# Patient Record
Sex: Male | Born: 1970
Health system: Southern US, Community
[De-identification: ages and names within clinical notes are randomized; demographics above are authoritative.]

## PROBLEM LIST (undated history)

## (undated) DIAGNOSIS — J302 Other seasonal allergic rhinitis: Secondary | ICD-10-CM

## (undated) DIAGNOSIS — M1990 Unspecified osteoarthritis, unspecified site: Secondary | ICD-10-CM

## (undated) DIAGNOSIS — F419 Anxiety disorder, unspecified: Secondary | ICD-10-CM

## (undated) DIAGNOSIS — T7840XA Allergy, unspecified, initial encounter: Secondary | ICD-10-CM

## (undated) HISTORY — PX: NO PAST SURGERIES: SHX2092

## (undated) HISTORY — DX: Unspecified osteoarthritis, unspecified site: M19.90

## (undated) HISTORY — DX: Other seasonal allergic rhinitis: J30.2

## (undated) HISTORY — DX: Allergy, unspecified, initial encounter: T78.40XA

## (undated) HISTORY — DX: Anxiety disorder, unspecified: F41.9

---

## 2013-07-11 ENCOUNTER — Other Ambulatory Visit: Payer: Self-pay

## 2013-07-11 ENCOUNTER — Encounter (HOSPITAL_BASED_OUTPATIENT_CLINIC_OR_DEPARTMENT_OTHER): Payer: Self-pay | Admitting: Emergency Medicine

## 2013-07-11 ENCOUNTER — Emergency Department (HOSPITAL_BASED_OUTPATIENT_CLINIC_OR_DEPARTMENT_OTHER)
Admission: EM | Admit: 2013-07-11 | Discharge: 2013-07-11 | Disposition: A | Discharge status: Home / Self Care | Payer: BC Managed Care – PPO | Attending: Emergency Medicine | Admitting: Emergency Medicine

## 2013-07-11 ENCOUNTER — Emergency Department (HOSPITAL_BASED_OUTPATIENT_CLINIC_OR_DEPARTMENT_OTHER): Payer: BC Managed Care – PPO

## 2013-07-11 DIAGNOSIS — X58XXXA Exposure to other specified factors, initial encounter: Secondary | ICD-10-CM | POA: Insufficient documentation

## 2013-07-11 DIAGNOSIS — R55 Syncope and collapse: Secondary | ICD-10-CM | POA: Insufficient documentation

## 2013-07-11 DIAGNOSIS — IMO0002 Reserved for concepts with insufficient information to code with codable children: Secondary | ICD-10-CM | POA: Insufficient documentation

## 2013-07-11 DIAGNOSIS — S1093XA Contusion of unspecified part of neck, initial encounter: Secondary | ICD-10-CM

## 2013-07-11 DIAGNOSIS — S0083XA Contusion of other part of head, initial encounter: Secondary | ICD-10-CM | POA: Insufficient documentation

## 2013-07-11 DIAGNOSIS — Y939 Activity, unspecified: Secondary | ICD-10-CM | POA: Insufficient documentation

## 2013-07-11 DIAGNOSIS — Z87891 Personal history of nicotine dependence: Secondary | ICD-10-CM | POA: Insufficient documentation

## 2013-07-11 DIAGNOSIS — Y929 Unspecified place or not applicable: Secondary | ICD-10-CM | POA: Insufficient documentation

## 2013-07-11 DIAGNOSIS — S0003XA Contusion of scalp, initial encounter: Secondary | ICD-10-CM | POA: Insufficient documentation

## 2013-07-11 LAB — BASIC METABOLIC PANEL
Anion gap: 14 (ref 5–15)
BUN: 26 mg/dL — ABNORMAL HIGH (ref 6–23)
CALCIUM: 9.3 mg/dL (ref 8.4–10.5)
CO2: 26 mEq/L (ref 19–32)
Chloride: 98 mEq/L (ref 96–112)
Creatinine, Ser: 1.3 mg/dL (ref 0.50–1.35)
GFR, EST AFRICAN AMERICAN: 76 mL/min — AB (ref >90)
GFR, EST NON AFRICAN AMERICAN: 66 mL/min — AB (ref >90)
GLUCOSE: 120 mg/dL — AB (ref 70–99)
Potassium: 3.9 mEq/L (ref 3.7–5.3)
SODIUM: 138 meq/L (ref 137–147)

## 2013-07-11 LAB — CBC
HCT: 38.8 % — ABNORMAL LOW (ref 39.0–52.0)
HEMOGLOBIN: 14.1 g/dL (ref 13.0–17.0)
MCH: 32.3 pg (ref 26.0–34.0)
MCHC: 36.3 g/dL — AB (ref 30.0–36.0)
MCV: 89 fL (ref 78.0–100.0)
PLATELETS: 179 10*3/uL (ref 150–400)
RBC: 4.36 MIL/uL (ref 4.22–5.81)
RDW: 12.5 % (ref 11.5–15.5)
WBC: 6 10*3/uL (ref 4.0–10.5)

## 2013-07-11 LAB — RAPID URINE DRUG SCREEN, HOSP PERFORMED
Amphetamines: NOT DETECTED
Barbiturates: NOT DETECTED
Benzodiazepines: NOT DETECTED
Cocaine: NOT DETECTED
OPIATES: NOT DETECTED
TETRAHYDROCANNABINOL: NOT DETECTED

## 2013-07-11 NOTE — ED Notes (Signed)
NP at bedside.
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
## 2013-07-11 NOTE — ED Provider Notes (Signed)
Medical screening examination/treatment/procedure(s) were performed by non-physician practitioner and as supervising physician I was immediately available for consultation/collaboration.   EKG Interpretation None        Houston Siren III, MD 07/11/13 2356

## 2013-07-11 NOTE — Discharge Instructions (Signed)
As discussed you can return here or to another er if you change your mind. i would recommend not taking your supplements or doing exercise until you have been evaluated further

## 2013-11-11 ENCOUNTER — Encounter (HOSPITAL_COMMUNITY): Payer: Self-pay

## 2013-11-11 ENCOUNTER — Emergency Department (HOSPITAL_COMMUNITY): Payer: BC Managed Care – PPO

## 2013-11-11 ENCOUNTER — Emergency Department (HOSPITAL_COMMUNITY)
Admission: EM | Admit: 2013-11-11 | Discharge: 2013-11-11 | Disposition: A | Discharge status: Home / Self Care | Payer: BC Managed Care – PPO | Attending: Emergency Medicine | Admitting: Emergency Medicine

## 2013-11-11 DIAGNOSIS — Z87891 Personal history of nicotine dependence: Secondary | ICD-10-CM | POA: Insufficient documentation

## 2013-11-11 DIAGNOSIS — S50812A Abrasion of left forearm, initial encounter: Secondary | ICD-10-CM | POA: Diagnosis not present

## 2013-11-11 DIAGNOSIS — S020XXA Fracture of vault of skull, initial encounter for closed fracture: Secondary | ICD-10-CM | POA: Diagnosis not present

## 2013-11-11 DIAGNOSIS — S0292XA Unspecified fracture of facial bones, initial encounter for closed fracture: Secondary | ICD-10-CM

## 2013-11-11 DIAGNOSIS — S50811A Abrasion of right forearm, initial encounter: Secondary | ICD-10-CM | POA: Insufficient documentation

## 2013-11-11 DIAGNOSIS — Y9289 Other specified places as the place of occurrence of the external cause: Secondary | ICD-10-CM | POA: Insufficient documentation

## 2013-11-11 DIAGNOSIS — Y9389 Activity, other specified: Secondary | ICD-10-CM | POA: Diagnosis not present

## 2013-11-11 DIAGNOSIS — S0990XA Unspecified injury of head, initial encounter: Secondary | ICD-10-CM | POA: Diagnosis present

## 2013-11-11 DIAGNOSIS — Y998 Other external cause status: Secondary | ICD-10-CM | POA: Insufficient documentation

## 2013-11-11 DIAGNOSIS — Z791 Long term (current) use of non-steroidal anti-inflammatories (NSAID): Secondary | ICD-10-CM | POA: Diagnosis not present

## 2013-11-11 LAB — CBC WITH DIFFERENTIAL/PLATELET
Basophils Absolute: 0 10*3/uL (ref 0.0–0.1)
Basophils Relative: 0 % (ref 0–1)
Eosinophils Absolute: 0 10*3/uL (ref 0.0–0.7)
Eosinophils Relative: 0 % (ref 0–5)
HEMATOCRIT: 38.6 % — AB (ref 39.0–52.0)
HEMOGLOBIN: 13.9 g/dL (ref 13.0–17.0)
LYMPHS ABS: 1 10*3/uL (ref 0.7–4.0)
Lymphocytes Relative: 12 % (ref 12–46)
MCH: 32.1 pg (ref 26.0–34.0)
MCHC: 36 g/dL (ref 30.0–36.0)
MCV: 89.1 fL (ref 78.0–100.0)
MONOS PCT: 4 % (ref 3–12)
Monocytes Absolute: 0.3 10*3/uL (ref 0.1–1.0)
NEUTROS ABS: 7 10*3/uL (ref 1.7–7.7)
Neutrophils Relative %: 84 % — ABNORMAL HIGH (ref 43–77)
Platelets: 135 10*3/uL — ABNORMAL LOW (ref 150–400)
RBC: 4.33 MIL/uL (ref 4.22–5.81)
RDW: 12.6 % (ref 11.5–15.5)
WBC: 8.4 10*3/uL (ref 4.0–10.5)

## 2013-11-11 LAB — COMPREHENSIVE METABOLIC PANEL
ALK PHOS: 53 U/L (ref 39–117)
ALT: 26 U/L (ref 0–53)
AST: 26 U/L (ref 0–37)
Albumin: 4.4 g/dL (ref 3.5–5.2)
Anion gap: 14 (ref 5–15)
BUN: 17 mg/dL (ref 6–23)
CO2: 24 mEq/L (ref 19–32)
Calcium: 8.6 mg/dL (ref 8.4–10.5)
Chloride: 102 mEq/L (ref 96–112)
Creatinine, Ser: 1.04 mg/dL (ref 0.50–1.35)
GFR, EST NON AFRICAN AMERICAN: 86 mL/min — AB (ref >90)
GLUCOSE: 103 mg/dL — AB (ref 70–99)
POTASSIUM: 4.2 meq/L (ref 3.7–5.3)
Sodium: 140 mEq/L (ref 137–147)
Total Bilirubin: 0.4 mg/dL (ref 0.3–1.2)
Total Protein: 6.7 g/dL (ref 6.0–8.3)

## 2013-11-11 LAB — RAPID URINE DRUG SCREEN, HOSP PERFORMED
Amphetamines: NOT DETECTED
Barbiturates: NOT DETECTED
Benzodiazepines: NOT DETECTED
Cocaine: NOT DETECTED
Opiates: NOT DETECTED
Tetrahydrocannabinol: NOT DETECTED

## 2013-11-11 LAB — ETHANOL: ALCOHOL ETHYL (B): 198 mg/dL — AB (ref 0–11)

## 2013-11-11 MED ORDER — ONDANSETRON 8 MG PO TBDP: 8.0000 mg | ORAL_TABLET | Freq: Three times a day (TID) | ORAL | Status: DC | PRN

## 2013-11-11 MED ORDER — HYDROCODONE-ACETAMINOPHEN 5-325 MG PO TABS: 2.0000 | ORAL_TABLET | ORAL | Status: DC | PRN

## 2013-11-11 MED ORDER — TETANUS-DIPHTH-ACELL PERTUSSIS 5-2.5-18.5 LF-MCG/0.5 IM SUSP: 0.5000 mL | Freq: Once | INTRAMUSCULAR | Status: DC

## 2013-11-11 MED ORDER — ONDANSETRON 4 MG PO TBDP
8.0000 mg | ORAL_TABLET | Freq: Once | ORAL | Status: AC
  Administered 2013-11-11: 8 mg via ORAL
  Filled 2013-11-11: qty 2
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
## 2013-11-11 NOTE — ED Notes (Signed)
Patient transported to CT 

## 2013-11-11 NOTE — Discharge Instructions (Signed)
Please return immediately to the ER if you have onset of clear fluid draining from your nose as this may be a sign of leakage of cerebrospinal fluid.  Do not blow your nose.  Sleep in a upright position to help with swelling.  Ice therapy as below.  Return to the ER for worsening pain, fevers, confusion or other new concerning symptoms.   Facial Fracture A facial fracture is a break in one of the bones of your face. HOME CARE INSTRUCTIONS   Protect the injured part of your face until it is healed.  Do not participate in activities which give chance for re-injury until your doctor approves.  Gently wash and dry your face.  Wear head and facial protection while riding a bicycle, motorcycle, or snowmobile. SEEK MEDICAL CARE IF:   An oral temperature above 102 F (38.9 C) develops.  You have severe headaches or notice changes in your vision.  You have new numbness or tingling in your face.  You develop nausea (feeling sick to your stomach), vomiting or a stiff neck. SEEK IMMEDIATE MEDICAL CARE IF:   You develop difficulty seeing or experience double vision.  You become dizzy, lightheaded, or faint.  You develop trouble speaking, breathing, or swallowing.  You have a watery discharge from your nose or ear. MAKE SURE YOU:   Understand these instructions.  Will watch your condition.  Will get help right away if you are not doing well or get worse. Document Released: 12/21/2004 Document Revised: 03/15/2011 Document Reviewed: 08/10/2007 [facility] Patient Information 2015 [place], [place]. This information is not intended to replace advice given to you by your health care provider. Make sure you discuss any questions you have with your health care provider.  Assault, General Assault includes any behavior, whether intentional or reckless, which results in bodily injury to another person and/or damage to property. Included in this would be any behavior, intentional or reckless, that by  its nature would be understood (interpreted) by a reasonable person as intent to harm another person or to damage his/her property. Threats may be oral or written. They may be communicated through regular mail, computer, fax, or phone. These threats may be direct or implied. FORMS OF ASSAULT INCLUDE:  Physically assaulting a person. This includes physical threats to inflict physical harm as well as:  Slapping.  Hitting.  Poking.  Kicking.  Punching.  Pushing.  Arson.  Sabotage.  Equipment vandalism.  Damaging or destroying property.  Throwing or hitting objects.  Displaying a weapon or an object that appears to be a weapon in a threatening manner.  Carrying a firearm of any kind.  Using a weapon to harm someone.  Using greater physical size/strength to intimidate another.  Making intimidating or threatening gestures.  Bullying.  Hazing.  Intimidating, threatening, hostile, or abusive language directed toward another person.  It communicates the intention to engage in violence against that person. And it leads a reasonable person to expect that violent behavior may occur.  Stalking another person. IF IT HAPPENS AGAIN:  Immediately call for emergency help (911 in [place]  If someone poses clear and immediate danger to you, seek legal authorities to have a protective or restraining order put in place.  Less threatening assaults can at least be reported to authorities. STEPS TO TAKE IF A SEXUAL ASSAULT HAS HAPPENED  Go to an area of safety. This may include a shelter or staying with a friend. Stay away from the area where you have been attacked. A large  percentage of sexual assaults are caused by a friend, relative or associate.  If medications were given by your caregiver, take them as directed for the full length of time prescribed.  Only take over-the-counter or prescription medicines for pain, discomfort, or fever as directed by your caregiver.  If you have  come in contact with a sexual disease, find out if you are to be tested again. If your caregiver is concerned about the HIV/AIDS virus, he/she may require you to have continued testing for several months.  For the protection of your privacy, test results can not be given over the phone. Make sure you receive the results of your test. If your test results are not back during your visit, make an appointment with your caregiver to find out the results. Do not assume everything is normal if you have not heard from your caregiver or the medical facility. It is important for you to follow up on all of your test results.  File appropriate papers with authorities. This is important in all assaults, even if it has occurred in a family or by a friend. SEEK MEDICAL CARE IF:  You have new problems because of your injuries.  You have problems that may be because of the medicine you are taking, such as:  Rash.  Itching.  Swelling.  Trouble breathing.  You develop belly (abdominal) pain, feel sick to your stomach (nausea) or are vomiting.  You begin to run a temperature.  You need supportive care or referral to a rape crisis center. These are centers with trained personnel who can help you get through this ordeal. SEEK IMMEDIATE MEDICAL CARE IF:  You are afraid of being threatened, beaten, or abused. In [place] call 911.  You receive new injuries related to abuse.  You develop severe pain in any area injured in the assault or have any change in your condition that concerns you.  You faint or lose consciousness.  You develop chest pain or shortness of breath. Document Released: 12/21/2004 Document Revised: 03/15/2011 Document Reviewed: 08/09/2007 [facility] Patient Information 2015 [place], [place]. This information is not intended to replace advice given to you by your health care provider. Make sure you discuss any questions you have with your health care provider.  Skull Fracture, Uncomplicated,  Adult You have a skull fracture. This happens when one of the bones of your head cracks or breaks. Your injury does not appear serious at this time and we feel you can be observed safely at home. An injury to the head that causes a skull fracture may also cause a concussion. With a concussion you may be knocked out for a brief moment (loss of consciousness). A concussion is the mildest form of traumatic brain injury. The symptoms of a concussion are short-lived and resolve on their own. The most common symptoms are confusion and forgetting what happened (amnesia). SYMPTOMS These minor symptoms may be experienced after discharge:  Memory difficulties.  Dizziness.  Headaches.  Hearing difficulties.  Vertigo or trouble with balance.  Depression.  Tiredness.  Difficulty with concentration.  Nausea.  Vomiting. A bruise on the brain (concussion) requires a few days for recovery the same as a bruise elsewhere on your body. It is common for people with injuries such as yours to experience such problems. Usually these problems disappear without medical care. If symptoms remain for more than a few days, notify your caregiver. See your caregiver sooner if symptoms are becoming worse rather than better. HOME CARE INSTRUCTIONS   During  the next 24 hours you should stay with an adult who can watch you for the above warning signs.  This person should wake you up every 02-03 hours to check on your condition, noting any of the above signs or symptoms. Problems which are getting worse mean you should call or return immediately to the facility where you were just seen, or to the nearest emergency department. In case of emergency or unconsciousness, call for local emergency medical help.  You should take clear liquids for the rest of the day and then resume your regular diet.  You should not take sedatives or alcoholic beverages for 48 hours after discharge.  After injuries such as yours, most serious  problems happen within the first 24 hours.  If x-rays or other testing were done, make sure you know how you are to get those results. It is your responsibility to call back for results when your caregiver suggests. Do not assume everything is fine if your caregiver has not been able to reach you.  Skull fractures usually do not need follow up x-rays. These fractures are not like broken arms. The position of the skull stays the same as when it was broken and usually heals without problems.  If you have a concussion be aware that symptoms may last up to a week or longer.  It is very important to keep any follow-up appointments after a head injury.  It is unlikely that serious side effects will occur. If they do occur it is usually soon after the accident but may occur as long as a week after the accident or injury. SEEK IMMEDIATE MEDICAL CARE IF:   Confusion or drowsiness develops; children frequently become drowsy after any type of trauma or injury.  A person cannot arouse the injured person.  You feel sick to your stomach (nausea) or persistent, forceful vomiting (projectile in nature).  Rapid back and forth movement of the eyes. This is called nystagmus.  Convulsions, seizures, or unconsciousness.  Severe persistent headaches not relieved by medication. Only take over-the-counter or prescription medicines for pain, discomfort, or fever as directed by your caregiver. (Do not take aspirin as this weakens blood clotting abilities).  Inability to use arms or legs appropriately.  Changes in pupil sizes.  Clear or bloody discharge from nose or ears. Document Released: 10/22/2003 Document Revised: 03/15/2011 Document Reviewed: 02/27/2008 [facility] Patient Information 2015 [place], [place]. This information is not intended to replace advice given to you by your health care provider. Make sure you discuss any questions you have with your health care provider.
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
## 2015-07-17 IMAGING — CT CT HEAD W/O CM
1 of 2 series · 16 of 30 positions shown, 20 images · non-contrast
Comparison: None

CLINICAL DATA: Syncopal episode.

EXAM:
CT HEAD WITHOUT CONTRAST
TECHNIQUE: Contiguous axial images were obtained from the base of the skull
through the vertex without contrast.

[Series 2: head 4.8 h37s · axial · 0.45mm/px · z∈[-124,+28]mm · 16 of 36 slices shown, 20 images]
[im 2/36  brain]
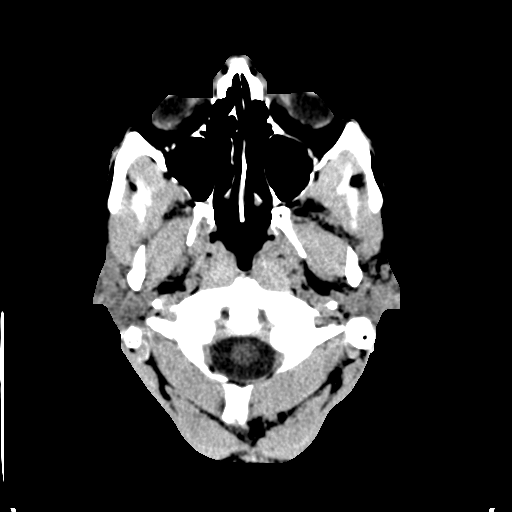
[im 2/36  bone]
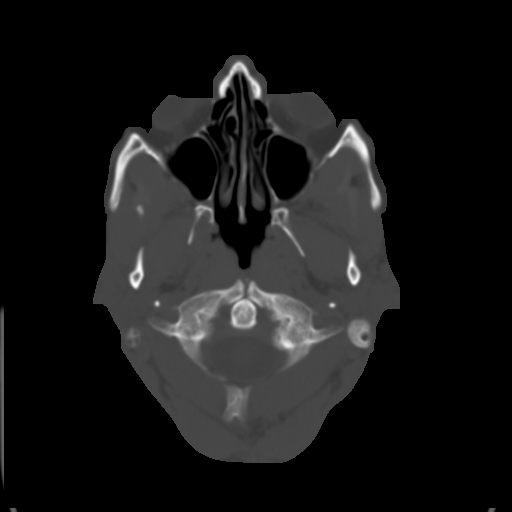
[im 5/36  brain]
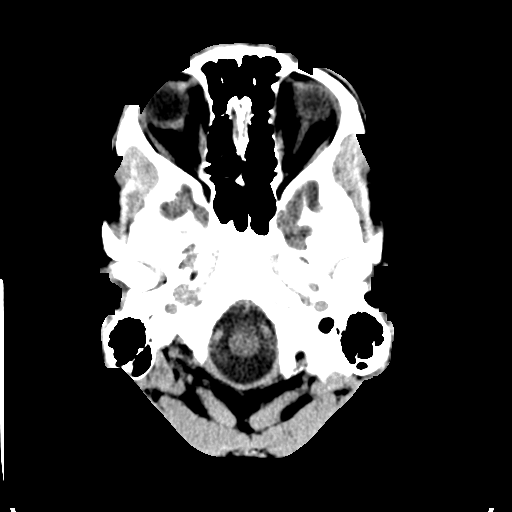
[im 6/36  brain]
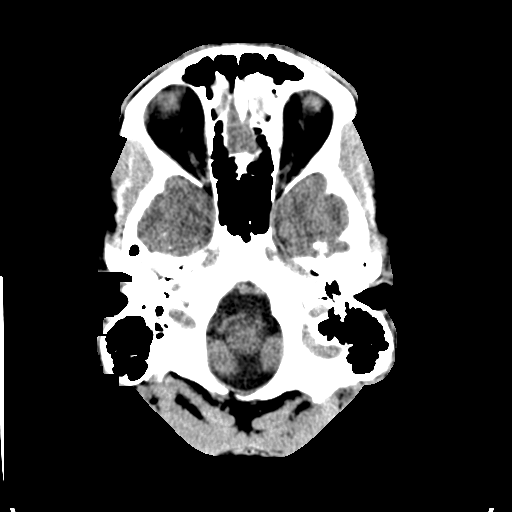
[im 9/36  brain]
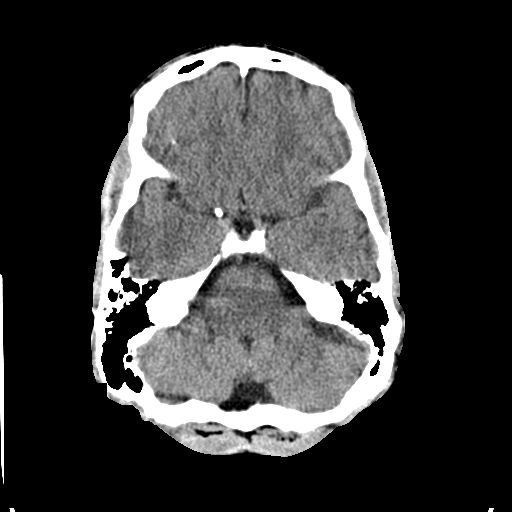
[im 11/36  brain]
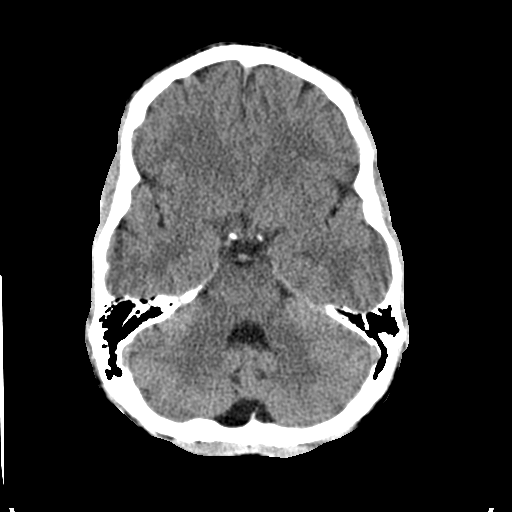
[im 11/36  bone]
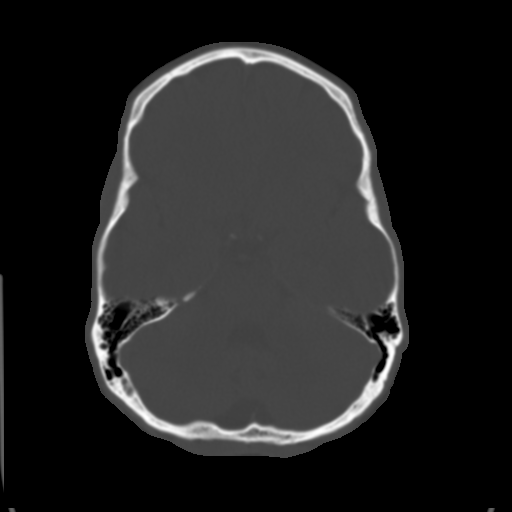
[im 12/36  brain]
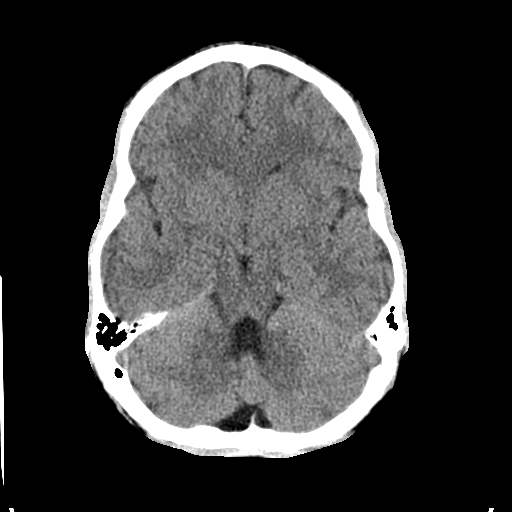
[im 15/36  brain]
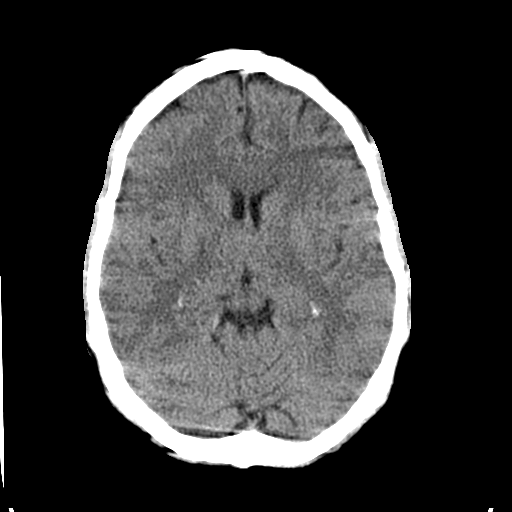
[im 17/36  brain]
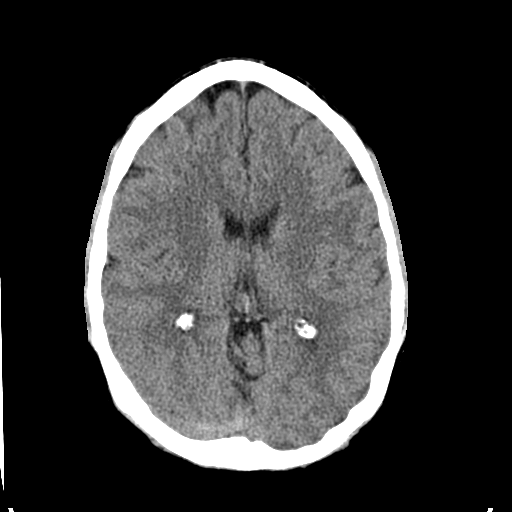
[im 19/36  brain]
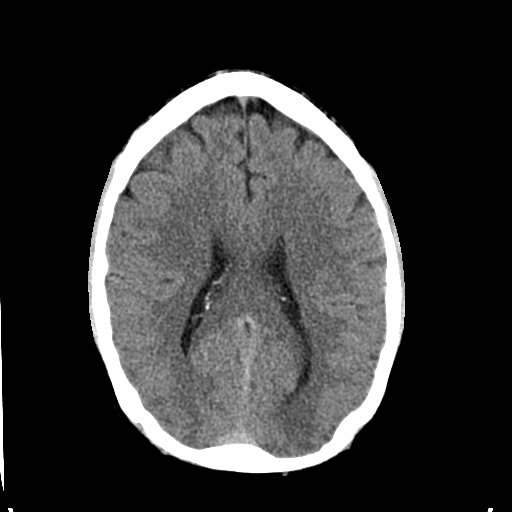
[im 19/36  bone]
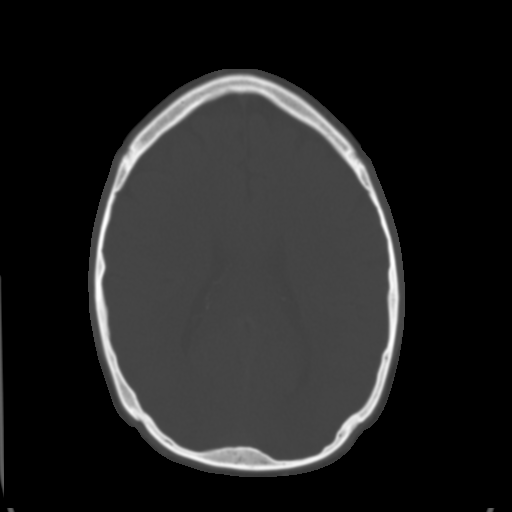
[im 21/36  brain]
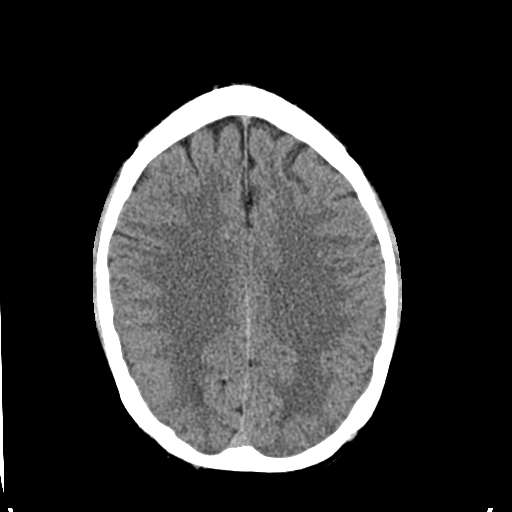
[im 24/36  brain]
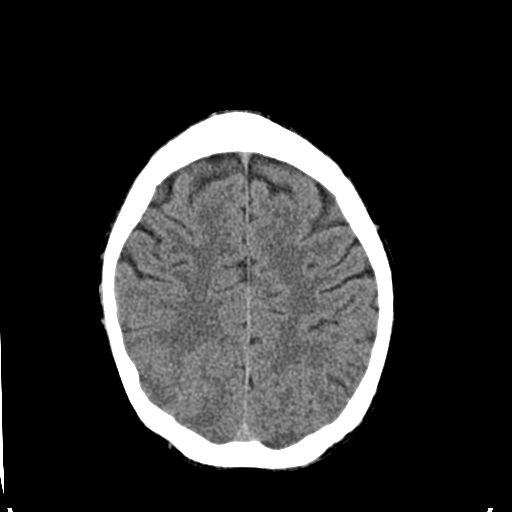
[im 25/36  brain]
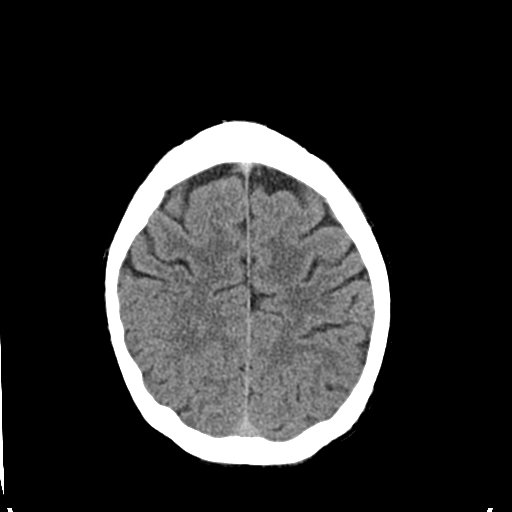
[im 27/36  brain]
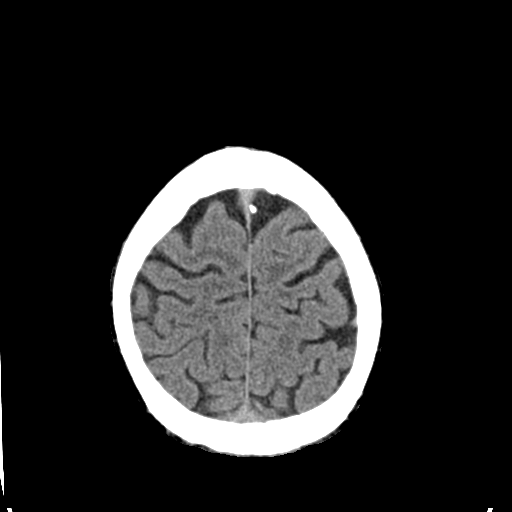
[im 27/36  bone]
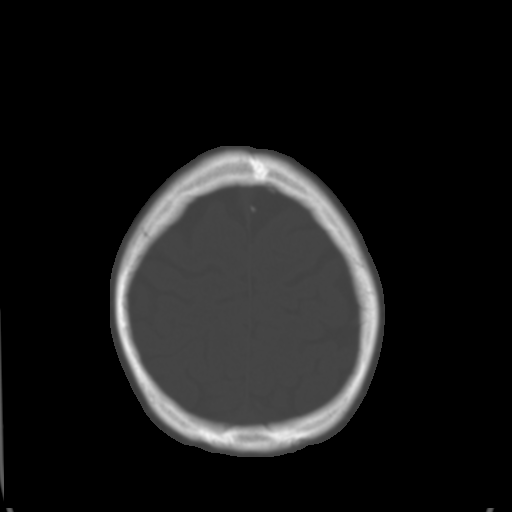
[im 30/36  brain]
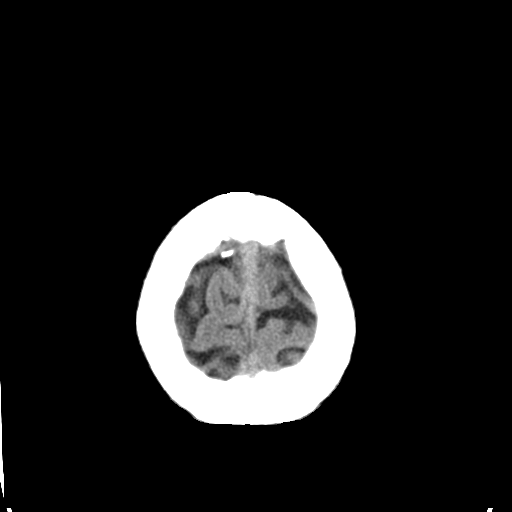
[im 31/36  brain]
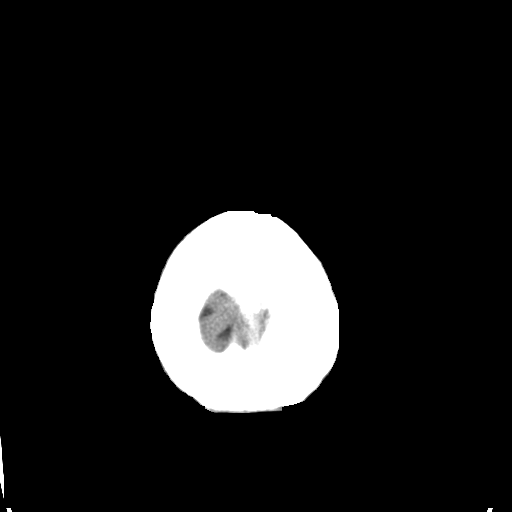
[im 34/36  brain]
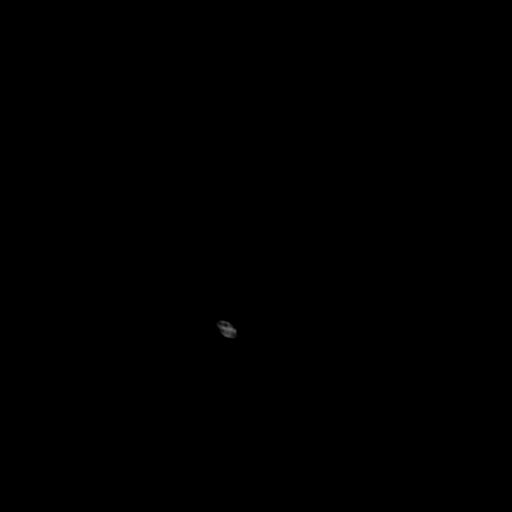

[16 of 30 positions shown; findings below may reference images not displayed]

FINDINGS: No evidence for acute hemorrhage, mass lesion, midline shift,
hydrocephalus or large infarct. No acute bone abnormality.
Visualized sinuses are clear.
IMPRESSION: No acute intracranial abnormality.

## 2017-03-17 DIAGNOSIS — J029 Acute pharyngitis, unspecified: Secondary | ICD-10-CM | POA: Diagnosis not present

## 2017-03-22 DIAGNOSIS — J029 Acute pharyngitis, unspecified: Secondary | ICD-10-CM | POA: Diagnosis not present

## 2017-04-25 ENCOUNTER — Encounter: Payer: Self-pay | Admitting: Family Medicine

## 2017-04-25 ENCOUNTER — Ambulatory Visit (INDEPENDENT_AMBULATORY_CARE_PROVIDER_SITE_OTHER): Payer: 59 | Admitting: Family Medicine

## 2017-04-25 DIAGNOSIS — M79601 Pain in right arm: Secondary | ICD-10-CM | POA: Diagnosis not present

## 2017-04-25 MED ORDER — PREDNISONE 10 MG PO TABS: ORAL_TABLET | ORAL | 0 refills | Status: DC

## 2017-04-25 NOTE — Patient Instructions (Signed)
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 Try prednisone dose pack x 6 days. Don't take aleve or ibuprofen with this but day after you finish prednisone you can start Aleve 2 tabs twice a day with food for pain and inflammation as needed. Vitamin B6 50mg  daily may be beneficial. Your neck and shoulder exams are normal - there isn't evidence of a herniated disc in your neck though if you weren't improving we could consider an MRI of your cervical spine. Follow up with me in 1 month for reevaluation.

## 2017-04-25 NOTE — Progress Notes (Signed)
PCP: Jefm Petty, MD  Subjective:   HPI: Patient is a 47 y.o. male here for right arm pain.  Patient reports for about 1 month he's had a sharp burning pain in his right index and middle fingers. No acute trauma but recalls reaching into the back of the dryer and feeling this pain that felt like these digits were on fire. He has tried sports massage, acupuncture. Pain level currently 0/10 but can be sharp. He works out regularly doing 30 minutes of spin and at gym 5 days a week at least. Worse with reaching up and out to the side. No neck or right shoulder pain. No skin changes, numbness.  History reviewed. No pertinent past medical history.  No current outpatient medications on file prior to visit.   No current facility-administered medications on file prior to visit.     History reviewed. No pertinent surgical history.  No Known Allergies  Social History   Socioeconomic History  . Marital status: Single    Spouse name: Not on file  . Number of children: Not on file  . Years of education: Not on file  . Highest education level: Not on file  Occupational History  . Not on file  Social Needs  . Financial resource strain: Not on file  . Food insecurity:    Worry: Not on file    Inability: Not on file  . Transportation needs:    Medical: Not on file    Non-medical: Not on file  Tobacco Use  . Smoking status: Former Research scientist (life sciences)  . Smokeless tobacco: Never Used  Substance and Sexual Activity  . Alcohol use: Yes  . Drug use: No  . Sexual activity: Not on file  Lifestyle  . Physical activity:    Days per week: Not on file    Minutes per session: Not on file  . Stress: Not on file  Relationships  . Social connections:    Talks on phone: Not on file    Gets together: Not on file    Attends religious service: Not on file    Active member of club or organization: Not on file    Attends meetings of clubs or organizations: Not on file    Relationship status: Not on  file  . Intimate partner violence:    Fear of current or ex partner: Not on file    Emotionally abused: Not on file    Physically abused: Not on file    Forced sexual activity: Not on file  Other Topics Concern  . Not on file  Social History Narrative  . Not on file    History reviewed. No pertinent family history.  BP 108/74   Pulse 92   Ht 5\' 8"  (1.727 m)   Wt 165 lb (74.8 kg)   BMI 25.09 kg/m   Review of Systems: See HPI above.     Objective:  Physical Exam:  Gen: NAD, comfortable in exam room  Neck: No gross deformity, swelling, bruising. No TTP.  No midline/bony TTP. FROM. BUE strength 5/5.   Sensation slightly diminished right index finger, 5th digit. 2+ equal reflexes in triceps, biceps, brachioradialis tendons. Negative spurlings. NVI distally aside from sensation noted above. Negative adson's.  Right shoulder: No swelling, ecchymoses.  No gross deformity. No TTP. FROM without pain. Negative Hawkins, Neers. Negative Yergasons. Strength 5/5 with empty can and resisted internal/external rotation. Negative apprehension. NVI distally aside from sensation noted above.   Assessment & Plan:  1. Right arm  pain - consistent with a traction injury to C7 nerve root.  Neck and shoulder exams reassuring.  Discussed lengthy time course for nerve healing typically.  Trial prednisone dose pack.  Aleve or ibuprofen after finishing this.  Consider B6.  F/u in 1 month.

## 2017-04-25 NOTE — Assessment & Plan Note (Signed)
consistent with a traction injury to C7 nerve root.  Neck and shoulder exams reassuring.  Discussed lengthy time course for nerve healing typically.  Trial prednisone dose pack.  Aleve or ibuprofen after finishing this.  Consider B6.  F/u in 1 month.

## 2017-05-26 ENCOUNTER — Encounter: Payer: Self-pay | Admitting: Family Medicine

## 2017-05-26 ENCOUNTER — Ambulatory Visit (INDEPENDENT_AMBULATORY_CARE_PROVIDER_SITE_OTHER): Payer: 59 | Admitting: Family Medicine

## 2017-05-26 DIAGNOSIS — M79601 Pain in right arm: Secondary | ICD-10-CM

## 2017-05-26 NOTE — Patient Instructions (Signed)
You're doing great. Try to take the aleve only in the morning for a few days, up to a week then discontinue completely if you're doing well. The supplements that may help with arthritis are Boswellia extract, curcumin, and pycnogenol. Follow up with me as needed otherwise.

## 2017-05-28 ENCOUNTER — Encounter: Payer: Self-pay | Admitting: Family Medicine

## 2017-05-28 NOTE — Progress Notes (Signed)
PCP: Jefm Petty, MD  Subjective:   HPI: Patient is a 47 y.o. male here for right arm pain.  4/22: Patient reports for about 1 month he's had a sharp burning pain in his right index and middle fingers. No acute trauma but recalls reaching into the back of the dryer and feeling this pain that felt like these digits were on fire. He has tried sports massage, acupuncture. Pain level currently 0/10 but can be sharp. He works out regularly doing 30 minutes of spin and at gym 5 days a week at least. Worse with reaching up and out to the side. No neck or right shoulder pain. No skin changes, numbness.  5/23: Patient reports he feels much better compared to last visit. Did well with prednisone dose pack. Only some tingling into right index and middle fingers with reaching. Taking aleve twice a day which helps. Pain currently 0/10. No skin changes.  History reviewed. No pertinent past medical history.  Current Outpatient Medications on File Prior to Visit  Medication Sig Dispense Refill  . omeprazole (PRILOSEC) 40 MG capsule   11  . triamcinolone (KENALOG) 0.1 % paste U TO COAT LESION AFTER MEALS AND HS  3   No current facility-administered medications on file prior to visit.     History reviewed. No pertinent surgical history.  No Known Allergies  Social History   Socioeconomic History  . Marital status: Single    Spouse name: Not on file  . Number of children: Not on file  . Years of education: Not on file  . Highest education level: Not on file  Occupational History  . Not on file  Social Needs  . Financial resource strain: Not on file  . Food insecurity:    Worry: Not on file    Inability: Not on file  . Transportation needs:    Medical: Not on file    Non-medical: Not on file  Tobacco Use  . Smoking status: Former Research scientist (life sciences)  . Smokeless tobacco: Never Used  Substance and Sexual Activity  . Alcohol use: Yes  . Drug use: No  . Sexual activity: Not on file   Lifestyle  . Physical activity:    Days per week: Not on file    Minutes per session: Not on file  . Stress: Not on file  Relationships  . Social connections:    Talks on phone: Not on file    Gets together: Not on file    Attends religious service: Not on file    Active member of club or organization: Not on file    Attends meetings of clubs or organizations: Not on file    Relationship status: Not on file  . Intimate partner violence:    Fear of current or ex partner: Not on file    Emotionally abused: Not on file    Physically abused: Not on file    Forced sexual activity: Not on file  Other Topics Concern  . Not on file  Social History Narrative  . Not on file    History reviewed. No pertinent family history.  BP 121/78   Pulse 60   Ht 5\' 7"  (1.702 m)   Wt 165 lb (74.8 kg)   BMI 25.84 kg/m   Review of Systems: See HPI above.     Objective:  Physical Exam:  Gen: NAD, comfortable in exam room  Neck: No gross deformity, swelling, bruising. No TTP .  No midline/bony TTP. FROM. BUE strength 5/5.  Sensation intact to light touch.   2+ equal reflexes in triceps, biceps, brachioradialis tendons. Negative spurlings. NV intact distal BUEs.  Assessment & Plan:  1. Right arm pain - consistent with traction injury to C7 nerve root.  Improved following prednisone dose pack.  Try to wean off the aleve but can take once daily for a few days then discontinue if possible.  F/u prn.

## 2017-05-28 NOTE — Assessment & Plan Note (Signed)
consistent with traction injury to C7 nerve root.  Improved following prednisone dose pack.  Try to wean off the aleve but can take once daily for a few days then discontinue if possible.  F/u prn.

## 2017-09-12 ENCOUNTER — Ambulatory Visit (INDEPENDENT_AMBULATORY_CARE_PROVIDER_SITE_OTHER): Payer: 59 | Admitting: Family Medicine

## 2017-09-12 ENCOUNTER — Encounter: Payer: Self-pay | Admitting: Family Medicine

## 2017-09-12 VITALS — BP 113/78 | HR 64 | Ht 68.0 in | Wt 165.0 lb

## 2017-09-12 DIAGNOSIS — G8929 Other chronic pain: Secondary | ICD-10-CM

## 2017-09-12 DIAGNOSIS — M25512 Pain in left shoulder: Secondary | ICD-10-CM | POA: Diagnosis not present

## 2017-09-12 MED ORDER — NITROGLYCERIN 0.2 MG/HR TD PT24
MEDICATED_PATCH | TRANSDERMAL | 1 refills | Status: DC
Start: 2017-09-12 — End: 2018-02-21

## 2017-09-12 NOTE — Patient Instructions (Signed)
I'm concerned you have a labral tear of your shoulder with radiation into your biceps. Continue your home exercises as you have been. Let me know if you want to do formal physical therapy with the modalities or the MRI arthrogram. Start nitro patches 1/4th patch to the shoulder, change daily. Follow up with me in 6 weeks otherwise.

## 2017-09-13 DIAGNOSIS — K1329 Other disturbances of oral epithelium, including tongue: Secondary | ICD-10-CM | POA: Diagnosis not present

## 2017-09-15 ENCOUNTER — Encounter: Payer: Self-pay | Admitting: Family Medicine

## 2017-09-15 NOTE — Progress Notes (Signed)
PCP: Jefm Petty, MD  Subjective:   HPI: Patient is a 47 y.o. male here for left shoulder pain.  Patient reports he's had about 10 years of left shoulder pain. Believes he hurt this shoulder working out a long time ago. He did physical therapy, was told by one doctor he should have surgery (described acromioplasty). Pain is 0/10 level but radiating down bicep to 7/10 level and sharp at times. Worse with sleeping in this shoulder and this is how he usually sleeps. Unsure if he had an MRI back when this occurred. No skin changes, numbness.  History reviewed. No pertinent past medical history.  Current Outpatient Medications on File Prior to Visit  Medication Sig Dispense Refill  . omeprazole (PRILOSEC) 40 MG capsule   11  . triamcinolone (KENALOG) 0.1 % paste U TO COAT LESION AFTER MEALS AND HS  3   No current facility-administered medications on file prior to visit.     History reviewed. No pertinent surgical history.  No Known Allergies  Social History   Socioeconomic History  . Marital status: Single    Spouse name: Not on file  . Number of children: Not on file  . Years of education: Not on file  . Highest education level: Not on file  Occupational History  . Not on file  Social Needs  . Financial resource strain: Not on file  . Food insecurity:    Worry: Not on file    Inability: Not on file  . Transportation needs:    Medical: Not on file    Non-medical: Not on file  Tobacco Use  . Smoking status: Former Research scientist (life sciences)  . Smokeless tobacco: Never Used  Substance and Sexual Activity  . Alcohol use: Yes  . Drug use: No  . Sexual activity: Not on file  Lifestyle  . Physical activity:    Days per week: Not on file    Minutes per session: Not on file  . Stress: Not on file  Relationships  . Social connections:    Talks on phone: Not on file    Gets together: Not on file    Attends religious service: Not on file    Active member of club or organization: Not  on file    Attends meetings of clubs or organizations: Not on file    Relationship status: Not on file  . Intimate partner violence:    Fear of current or ex partner: Not on file    Emotionally abused: Not on file    Physically abused: Not on file    Forced sexual activity: Not on file  Other Topics Concern  . Not on file  Social History Narrative  . Not on file    History reviewed. No pertinent family history.  BP 113/78   Pulse 64   Ht 5\' 8"  (1.727 m)   Wt 165 lb (74.8 kg)   BMI 25.09 kg/m   Review of Systems: See HPI above.     Objective:  Physical Exam:  Gen: NAD, comfortable in exam room  Left shoulder: No swelling, ecchymoses.  No gross deformity. No TTP. FROM. Negative Hawkins, Neers. Negative Yergasons. Strength 5/5 with empty can and resisted internal/external rotation. Negative apprehension. NV intact distally. Equivocal o'briens.  Right shoulder: No swelling, ecchymoses.  No gross deformity. No TTP. FROM. Strength 5/5 with empty can and resisted internal/external rotation. NV intact distally.   Assessment & Plan:  1. Left shoulder pain - chronic, present for over 10 years.  His exam is reassuring.  I'm concerned he has a labral tear given pain today is posterior but radiates into bicep as is common with superior labral tears and o'briens test was equivocal.  He will start nitro patches.  Has done formal physical therapy already.  Home exercises.  Consider MR arthrogram if not improving.  F/u in 6 weeks.

## 2017-09-28 ENCOUNTER — Telehealth: Payer: Self-pay | Admitting: Family Medicine

## 2017-09-28 MED ORDER — PREDNISONE 10 MG PO TABS: ORAL_TABLET | ORAL | 0 refills | Status: DC

## 2017-09-28 NOTE — Telephone Encounter (Signed)
Patient states the nitro patches are not helping with his shoulder pain. Patient is asking if prednisone can be prescribed instead. He says the prednisone has worked for him in the past when he had neck pain.   Pharmacy: [facility] in [facility] on [address]

## 2017-09-28 NOTE — Telephone Encounter (Signed)
We can do that too - I'd encourage him to stick with the nitro patches though - they usually take several weeks before you notice a benefit (studies showed it at 6 weeks).  Sent prednisone to his [facility].

## 2017-10-19 ENCOUNTER — Telehealth: Payer: Self-pay

## 2017-10-19 NOTE — Telephone Encounter (Signed)
I"m sorry, I am not taking new patients at this time.  The website needs to be updated.  I apologize for the inconvenience

## 2017-10-19 NOTE — Telephone Encounter (Signed)
Called and spoke to pt and informed him that Dr. Lorelei Pont is not accepting new patients. I informed pt that we have 2 other providers that are taking new patients and asked if he would like to establish with one of them. The pt agreed and I set a new patient appt for 10/28/17 with Dr. Nani Ravens.

## 2017-10-19 NOTE — Telephone Encounter (Signed)
Copied from [place] [id number]. Topic: Appointment Scheduling - Scheduling Inquiry for Clinic >> Oct 19, 2017  1:50 PM Margot Ables wrote: Reason for CRM: pt called to see if he can establish care with Dr. Lorelei Pont. He was referred to her by existing pt Travis Rose. He is wanting to change PCP from Dr. Jeralene Huff at [facility]. [facility] website shows Dr. Lorelei Pont is taking new pts. I advised him that she is not but he asked if she can make exception.

## 2017-10-28 ENCOUNTER — Encounter: Payer: Self-pay | Admitting: Family Medicine

## 2017-10-28 ENCOUNTER — Ambulatory Visit (INDEPENDENT_AMBULATORY_CARE_PROVIDER_SITE_OTHER): Payer: 59 | Admitting: Family Medicine

## 2017-10-28 VITALS — BP 118/70 | HR 73 | Temp 98.2°F | Ht 67.0 in | Wt 170.1 lb

## 2017-10-28 DIAGNOSIS — L309 Dermatitis, unspecified: Secondary | ICD-10-CM

## 2017-10-28 DIAGNOSIS — Z23 Encounter for immunization: Secondary | ICD-10-CM

## 2017-10-28 DIAGNOSIS — T7840XA Allergy, unspecified, initial encounter: Secondary | ICD-10-CM | POA: Diagnosis not present

## 2017-10-28 MED ORDER — TRIAMCINOLONE ACETONIDE 0.1 % EX CREA: 1.0000 "application " | TOPICAL_CREAM | Freq: Two times a day (BID) | CUTANEOUS | 0 refills | Status: DC

## 2017-10-28 NOTE — Progress Notes (Signed)
Chief Complaint  Patient presents with  . New Patient (Initial Visit)       New Patient Visit SUBJECTIVE: HPI: Travis Rose is an 47 y.o.male who is being seen for establishing care.  The patient was previously seen at [facility].  Hx of eczema. Seems to be irritated by multiple things. Saw an allergist in past, but was only checked for food allergies and nothing significant was positive. Had a sore throat that resolved after prednisone, thought it could have been allergies. Uses Kenalog for issue when it arises and it generally helps.   No Known Allergies  Past Medical History:  Diagnosis Date  . Allergy    History reviewed. No pertinent surgical history. History reviewed. No pertinent family history. No Known Allergies  Current Outpatient Medications:  .  nitroGLYCERIN (NITRODUR - DOSED IN MG/24 HR) 0.2 mg/hr patch, Apply 1/4th patch to affected shoulder, change daily, Disp: 30 patch, Rfl: 1 .  triamcinolone cream (KENALOG) 0.1 %, Apply 1 application topically 2 (two) times daily., Disp: 30 g, Rfl: 0  ROS Skin: +sensitive skin  HEENT: Denies current sore throat   OBJECTIVE: BP 118/70 (BP Location: Left Arm, Patient Position: Sitting, Cuff Size: Normal)   Pulse 73   Temp 98.2 F (36.8 C) (Oral)   Ht 5\' 7"  (1.702 m)   Wt 170 lb 2 oz (77.2 kg)   SpO2 97%   BMI 26.65 kg/m   Constitutional: -  VS reviewed -  Well developed, well nourished, appears stated age -  No apparent distress  Psychiatric: -  Oriented to person, place, and time -  Memory intact -  Affect and mood normal -  Fluent conversation, good eye contact -  Judgment and insight age appropriate  Eye: -  Conjunctivae clear, no discharge -  Pupils symmetric, round, reactive to light  ENMT: -  MMM    Pharynx moist, no exudate, no erythema  Neck: -  No gross swelling, no palpable masses -  Thyroid midline, not enlarged, mobile, no palpable masses  Cardiovascular: -  RRR -  No LE edema  Respiratory: -   Normal respiratory effort, no accessory muscle use, no retraction -  Breath sounds equal, no wheezes, no ronchi, no crackles  Gastrointestinal: -  Bowel sounds normal -  No tenderness, no distention, no guarding, no masses  Neurological:  -  CN II - XII grossly intact -  DTRs equal and symmetric  Musculoskeletal: -  No clubbing, no cyanosis -  Gait normal  Skin: -  Annular area of erythema on volar surface of R forearm. No ttp, excessive warmth, fluctuance -  Warm and dry to palpation   ASSESSMENT/PLAN: Eczema, unspecified type - Plan: triamcinolone cream (KENALOG) 0.1 %, Ambulatory referral to Allergy  Allergic state, initial encounter  Refer to allergy team for discussion of allergy testing. Refill cream for prn instances. Refuses flu shot. Patient should return at earliest convenience for CPE. The patient voiced understanding and agreement to the plan.   [place], DO 10/28/17  3:12 PM

## 2017-10-28 NOTE — Progress Notes (Signed)
Pre visit review using our clinic review tool, if applicable. No additional management support is needed unless otherwise documented below in the visit note. 

## 2017-10-28 NOTE — Patient Instructions (Addendum)
If you do not hear anything about your referral in the next 1-2 weeks, call our office and ask for an update.  Stretch your pecs.   Let us know if you need anything.

## 2017-10-28 NOTE — Addendum Note (Signed)
Addended by: Sharon Seller B on: 10/28/2017 03:31 PM   Modules accepted: Orders

## 2017-10-31 ENCOUNTER — Encounter: Payer: Self-pay | Admitting: Family Medicine

## 2017-11-24 ENCOUNTER — Ambulatory Visit (INDEPENDENT_AMBULATORY_CARE_PROVIDER_SITE_OTHER): Payer: 59 | Admitting: Allergy and Immunology

## 2017-11-24 ENCOUNTER — Encounter: Payer: Self-pay | Admitting: Allergy and Immunology

## 2017-11-24 VITALS — BP 100/62 | HR 80 | Temp 98.1°F | Resp 16 | Ht 67.0 in | Wt 168.8 lb

## 2017-11-24 DIAGNOSIS — J3089 Other allergic rhinitis: Secondary | ICD-10-CM | POA: Diagnosis not present

## 2017-11-24 DIAGNOSIS — H1013 Acute atopic conjunctivitis, bilateral: Secondary | ICD-10-CM

## 2017-11-24 DIAGNOSIS — H1010 Acute atopic conjunctivitis, unspecified eye: Secondary | ICD-10-CM | POA: Insufficient documentation

## 2017-11-24 DIAGNOSIS — K1370 Unspecified lesions of oral mucosa: Secondary | ICD-10-CM | POA: Insufficient documentation

## 2017-11-24 DIAGNOSIS — L308 Other specified dermatitis: Secondary | ICD-10-CM | POA: Diagnosis not present

## 2017-11-24 MED ORDER — LEVOCETIRIZINE DIHYDROCHLORIDE 5 MG PO TABS: 5.0000 mg | ORAL_TABLET | Freq: Every evening | ORAL | 5 refills | Status: DC

## 2017-11-24 MED ORDER — OLOPATADINE HCL 0.2 % OP SOLN: OPHTHALMIC | 5 refills | Status: DC

## 2017-11-24 MED ORDER — CRISABOROLE 2 % EX OINT: 1.0000 "application " | TOPICAL_OINTMENT | Freq: Two times a day (BID) | CUTANEOUS | 5 refills | Status: DC | PRN

## 2017-11-24 MED ORDER — FLUTICASONE PROPIONATE 50 MCG/ACT NA SUSP: NASAL | 5 refills | Status: DC

## 2017-11-24 NOTE — Assessment & Plan Note (Addendum)
   Continue appropriate skin care measures.  Samples and a prescription have been provided for Eucrisa (crisaborole) 2% ointment twice a day to affected areas as needed.  If needed, resume use of triamcinolone 0.1% cream sparingly to affected areas as needed.  Make note of any foods or environmental exposures which correlate with flares.

## 2017-11-24 NOTE — Assessment & Plan Note (Addendum)
Unclear etiology.  Continue triamcinolone 0.1% dental paste sparingly to affected areas twice daily if needed.  Make note of any foods or beverages which correlate with flares.

## 2017-11-24 NOTE — Assessment & Plan Note (Signed)
   Aeroallergen avoidance measures have been discussed and provided in written form.  A prescription has been provided for levocetirizine, 5 mg daily as needed.  To avoid diminishing benefit with daily use (tachyphylaxis) of second generation antihistamine, consider alternating every few months between fexofenadine (Allegra) and levocetirizine (Xyzal).  A prescription has been provided for fluticasone nasal spray, one spray per nostril 1-2 times daily as needed. Proper nasal spray technique has been discussed and demonstrated.  Nasal saline spray (i.e. Simply Saline) is recommended prior to medicated nasal sprays and as needed.  If allergen avoidance measures and medications fail to adequately relieve symptoms, aeroallergen immunotherapy will be considered.

## 2017-11-24 NOTE — Assessment & Plan Note (Signed)
   Treatment plan as outlined above for allergic rhinitis.  A prescription has been provided for Pataday, one drop per eye daily as needed.  I have also recommended eye lubricant drops (i.e., Natural Tears) as needed. 

## 2017-11-24 NOTE — Progress Notes (Signed)
New Patient Note  RE: Travis Rose MRN: [id number] DOB: Apr 17, 1970 Date of Office Visit: 11/24/2017  Referring provider: Shelda Pal* Primary care provider: Shelda Pal, DO  Chief Complaint: Rash and Allergic Rhinitis    History of present illness: Travis Rose is a 47 y.o. male seen today in consultation requested by Shelda Pal, DO. He reports that he is "allergic to everything".  He believes this because he seems to develop a rash with any detergents containing fragrance or dyes.  The rash developed on his right forearm which appears in a picture to have had small vesicles.  He denies pain or pruritus with this rash.  The rash improved with triamcinolone 0.1% cream.  He reports that approximately 2 years ago he developed a persistent bump on the inside of his lower lip which was nonpruritic and nonpainful.  A biopsy of the lesion was taken, however he does not recall the findings and I do not have access to the histology report.  He states that he feels an area on the right buccal mucosa that feels like "extra skin."  This area is nonpainful and nonpruritic.  No specific medication, food, skin care product, detergent, soap, or other environmental triggers have been identified. Travis Rose experiences nasal congestion, rhinorrhea, sneezing, postnasal drainage, nasal pruritus, and ocular pruritus.  These symptoms are most frequent and severe during the springtime and fall.  He attempts to control the symptoms with diphenhydramine or cetirizine.  Assessment and plan: Seasonal allergic rhinitis  Aeroallergen avoidance measures have been discussed and provided in written form.  A prescription has been provided for levocetirizine, 5 mg daily as needed.  To avoid diminishing benefit with daily use (tachyphylaxis) of second generation antihistamine, consider alternating every few months between fexofenadine (Allegra) and levocetirizine (Xyzal).  A  prescription has been provided for fluticasone nasal spray, one spray per nostril 1-2 times daily as needed. Proper nasal spray technique has been discussed and demonstrated.  Nasal saline spray (i.e. Simply Saline) is recommended prior to medicated nasal sprays and as needed.  If allergen avoidance measures and medications fail to adequately relieve symptoms, aeroallergen immunotherapy will be considered.  Allergic conjunctivitis  Treatment plan as outlined above for allergic rhinitis.  A prescription has been provided for Pataday, one drop per eye daily as needed.  I have also recommended eye lubricant drops (i.e., Natural Tears) as needed.  Eczema  Continue appropriate skin care measures.  Samples and a prescription have been provided for Eucrisa (crisaborole) 2% ointment twice a day to affected areas as needed.  If needed, resume use of triamcinolone 0.1% cream sparingly to affected areas as needed.  Make note of any foods or environmental exposures which correlate with flares.  Mouth lesion Unclear etiology.  Continue triamcinolone 0.1% dental paste sparingly to affected areas twice daily if needed.  Make note of any foods or beverages which correlate with flares.   Meds ordered this encounter  Medications  . levocetirizine (XYZAL) 5 MG tablet    Sig: Take 1 tablet (5 mg total) by mouth every evening.    Dispense:  30 tablet    Refill:  5  . fluticasone (FLONASE) 50 MCG/ACT nasal spray    Sig: One spray each nostril one-two times a day as needed for nasal congestion or drainage.    Dispense:  16 g    Refill:  5  . Olopatadine HCl (PATADAY) 0.2 % SOLN    Sig: One drop each eye once a day  as needed for itchy eyes.    Dispense:  2.5 mL    Refill:  5  . Crisaborole (EUCRISA) 2 % OINT    Sig: Apply 1 application topically 2 (two) times daily as needed (to red itchy areas).    Dispense:  100 g    Refill:  5    Diagnostics: Environmental skin testing: Robust  reactivity to grass pollen, weed pollen, ragweed pollen, and tree pollen. Food allergen skin testing: Negative despite a positive histamine control.    Physical examination: Blood pressure 100/62, pulse 80, temperature 98.1 F (36.7 C), temperature source Oral, resp. rate 16, height 5\' 7"  (1.702 m), weight 168 lb 12.8 oz (76.6 kg), SpO2 96 %.  General: Alert, interactive, in no acute distress. HEENT: TMs pearly gray, turbinates moderately edematous without discharge, post-pharynx moderately erythematous. Neck: Supple without lymphadenopathy. Lungs: Clear to auscultation without wheezing, rhonchi or rales. CV: Normal S1, S2 without murmurs. Abdomen: Nondistended, nontender. Skin: Warm and dry, without lesions or rashes. Extremities:  No clubbing, cyanosis or edema. Neuro:   Grossly intact . Review of systems:  Review of systems negative except as noted in HPI / PMHx or noted below: Review of Systems  Constitutional: Negative.   HENT: Negative.   Eyes: Negative.   Respiratory: Negative.   Cardiovascular: Negative.   Gastrointestinal: Negative.   Genitourinary: Negative.   Musculoskeletal: Negative.   Skin: Negative.   Neurological: Negative.   Endo/Heme/Allergies: Negative.   Psychiatric/Behavioral: Negative.     Past medical history:  Past Medical History:  Diagnosis Date  . Allergy     Past surgical history:  Past Surgical History:  Procedure Laterality Date  . NO PAST SURGERIES      Family history: Family History  Problem Relation Age of Onset  . Allergic rhinitis Mother   . Allergic rhinitis Brother   . Asthma Brother   . Angioedema Neg Hx   . Eczema Neg Hx   . Immunodeficiency Neg Hx   . Urticaria Neg Hx     Social history: Social History   Socioeconomic History  . Marital status: Single    Spouse name: Not on file  . Number of children: Not on file  . Years of education: Not on file  . Highest education level: Not on file  Occupational History    . Not on file  Social Needs  . Financial resource strain: Not on file  . Food insecurity:    Worry: Not on file    Inability: Not on file  . Transportation needs:    Medical: Not on file    Non-medical: Not on file  Tobacco Use  . Smoking status: Former Smoker    Packs/day: 0.50    Years: 5.00    Pack years: 2.50    Types: Cigarettes    Last attempt to quit: 01/04/2002    Years since quitting: 15.8  . Smokeless tobacco: Never Used  Substance and Sexual Activity  . Alcohol use: Yes    Alcohol/week: 2.0 standard drinks    Types: 2 Cans of beer per week  . Drug use: No  . Sexual activity: Not on file  Lifestyle  . Physical activity:    Days per week: Not on file    Minutes per session: Not on file  . Stress: Not on file  Relationships  . Social connections:    Talks on phone: Not on file    Gets together: Not on file    Attends religious service:  Not on file    Active member of club or organization: Not on file    Attends meetings of clubs or organizations: Not on file    Relationship status: Not on file  . Intimate partner violence:    Fear of current or ex partner: Not on file    Emotionally abused: Not on file    Physically abused: Not on file    Forced sexual activity: Not on file  Other Topics Concern  . Not on file  Social History Narrative  . Not on file   Environmental History: The patient lives in a 48 year old house with carpeting the bedroom, gas heat, and central air.  There is no known mold/water damage in the home.  He is a non-smoker.  There is a dog in the home which has access to his bedroom.  Allergies as of 11/24/2017   No Known Allergies     Medication List        Accurate as of 11/24/17  8:17 PM. Always use your most recent med list.          ADVIL 200 MG tablet Generic drug:  ibuprofen Take 200 mg by mouth every 6 (six) hours as needed.   ALEVE PO Take by mouth.   aspirin-acetaminophen-caffeine 268-341-96 MG tablet Commonly known  as:  EXCEDRIN MIGRAINE Take by mouth.   Crisaborole 2 % Oint Apply 1 application topically 2 (two) times daily as needed (to red itchy areas).   fluticasone 50 MCG/ACT nasal spray Commonly known as:  FLONASE One spray each nostril one-two times a day as needed for nasal congestion or drainage.   levocetirizine 5 MG tablet Commonly known as:  XYZAL Take 1 tablet (5 mg total) by mouth every evening.   nitroGLYCERIN 0.2 mg/hr patch Commonly known as:  NITRODUR - Dosed in mg/24 hr Apply 1/4th patch to affected shoulder, change daily   Olopatadine HCl 0.2 % Soln One drop each eye once a day as needed for itchy eyes.   triamcinolone cream 0.1 % Commonly known as:  KENALOG Apply 1 application topically 2 (two) times daily.       Known medication allergies: No Known Allergies  I appreciate the opportunity to take part in Aj's care. Please do not hesitate to contact me with questions.  Sincerely,   R. Edgar Frisk, MD

## 2017-11-24 NOTE — Patient Instructions (Addendum)
Seasonal allergic rhinitis  Aeroallergen avoidance measures have been discussed and provided in written form.  A prescription has been provided for levocetirizine, 5 mg daily as needed.  To avoid diminishing benefit with daily use (tachyphylaxis) of second generation antihistamine, consider alternating every few months between fexofenadine (Allegra) and levocetirizine (Xyzal).  A prescription has been provided for fluticasone nasal spray, one spray per nostril 1-2 times daily as needed. Proper nasal spray technique has been discussed and demonstrated.  Nasal saline spray (i.e. Simply Saline) is recommended prior to medicated nasal sprays and as needed.  If allergen avoidance measures and medications fail to adequately relieve symptoms, aeroallergen immunotherapy will be considered.  Allergic conjunctivitis  Treatment plan as outlined above for allergic rhinitis.  A prescription has been provided for Pataday, one drop per eye daily as needed.  I have also recommended eye lubricant drops (i.e., Natural Tears) as needed.  Eczema  Continue appropriate skin care measures.  Samples and a prescription have been provided for Eucrisa (crisaborole) 2% ointment twice a day to affected areas as needed.  If needed, resume use of triamcinolone 0.1% cream sparingly to affected areas as needed.  Make note of any foods or environmental exposures which correlate with flares.  Mouth lesion Unclear etiology.  Continue triamcinolone 0.1% dental paste sparingly to affected areas twice daily if needed.  Make note of any foods or beverages which correlate with flares.   Reducing Pollen Exposure  The American Academy of Allergy, Asthma and Immunology suggests the following steps to reduce your exposure to pollen during allergy seasons.    1. Do not hang sheets or clothing out to dry; pollen may collect on these items. 2. Do not mow lawns or spend time around freshly cut grass; mowing stirs up  pollen. 3. Keep windows closed at night.  Keep car windows closed while driving. 4. Minimize morning activities outdoors, a time when pollen counts are usually at their highest. 5. Stay indoors as much as possible when pollen counts or humidity is high and on windy days when pollen tends to remain in the air longer. 6. Use air conditioning when possible.  Many air conditioners have filters that trap the pollen spores. 7. Use a HEPA room air filter to remove pollen form the indoor air you breathe.

## 2017-12-14 DIAGNOSIS — K1329 Other disturbances of oral epithelium, including tongue: Secondary | ICD-10-CM | POA: Diagnosis not present

## 2017-12-21 ENCOUNTER — Encounter: Payer: Self-pay | Admitting: Family Medicine

## 2017-12-21 ENCOUNTER — Other Ambulatory Visit: Payer: Self-pay | Admitting: Family Medicine

## 2017-12-21 ENCOUNTER — Telehealth: Payer: Self-pay

## 2017-12-21 ENCOUNTER — Ambulatory Visit (INDEPENDENT_AMBULATORY_CARE_PROVIDER_SITE_OTHER): Payer: 59 | Admitting: Family Medicine

## 2017-12-21 ENCOUNTER — Other Ambulatory Visit (HOSPITAL_COMMUNITY)
Admission: RE | Admit: 2017-12-21 | Discharge: 2017-12-21 | Disposition: A | Discharge status: Home / Self Care | Payer: 59 | Source: Ambulatory Visit | Attending: Family Medicine | Admitting: Family Medicine

## 2017-12-21 VITALS — BP 118/80 | HR 81 | Temp 97.9°F | Ht 67.0 in | Wt 170.5 lb

## 2017-12-21 DIAGNOSIS — Z1159 Encounter for screening for other viral diseases: Secondary | ICD-10-CM

## 2017-12-21 DIAGNOSIS — Z113 Encounter for screening for infections with a predominantly sexual mode of transmission: Secondary | ICD-10-CM | POA: Diagnosis not present

## 2017-12-21 DIAGNOSIS — Z114 Encounter for screening for human immunodeficiency virus [HIV]: Secondary | ICD-10-CM | POA: Diagnosis not present

## 2017-12-21 DIAGNOSIS — L989 Disorder of the skin and subcutaneous tissue, unspecified: Secondary | ICD-10-CM

## 2017-12-21 MED ORDER — MUPIROCIN CALCIUM 2 % EX CREA: 1.0000 "application " | TOPICAL_CREAM | Freq: Two times a day (BID) | CUTANEOUS | 0 refills | Status: DC

## 2017-12-21 MED ORDER — CLOBETASOL PROPIONATE 0.05 % EX CREA: 1.0000 "application " | TOPICAL_CREAM | Freq: Two times a day (BID) | CUTANEOUS | 0 refills | Status: AC

## 2017-12-21 MED FILL — MUPIROCIN 2% OINTMENT: 2 | 14 days supply | Qty: 22 | Fill #0

## 2017-12-21 NOTE — Patient Instructions (Addendum)
Give [place] 5-6 business days to get the results of your testing back.   Try the cream twice daily for 14 days. If it comes back or doesn't get better, let me know and we will get you set up with a dermatologist.  Let us know if you need anything.

## 2017-12-21 NOTE — Telephone Encounter (Signed)
PA initiated via Covermymeds; KEY: [id number]. Awaiting determination.

## 2017-12-21 NOTE — Progress Notes (Signed)
Pre visit review using our clinic review tool, if applicable. No additional management support is needed unless otherwise documented below in the visit note. 

## 2017-12-21 NOTE — Progress Notes (Signed)
Chief Complaint  Patient presents with  . Rash    Travis Rose is a 47 y.o. male here for a skin complaint.  Patient has been dealing with skin lesions over the past several months.  He believes it all originated earlier in the year after he had a filling placed in his mouth.  His oral surgeon thinks it is related to lichen planus in his mouth.  He was prescribed Eucrisa and triamcinolone for lesions on his arms and lower extremities.  It does help, however lesions return if he stops using it.  ROS:  Const: No fevers Skin: As noted in HPI  Past Medical History:  Diagnosis Date  . Allergy     BP 118/80 (BP Location: Left Arm, Patient Position: Sitting, Cuff Size: Normal)   Pulse 81   Temp 97.9 F (36.6 C) (Oral)   Ht 5\' 7"  (1.702 m)   Wt 170 lb 8 oz (77.3 kg)   SpO2 98%   BMI 26.70 kg/m  Gen: awake, alert, appearing stated age Lungs: No accessory muscle use Skin: Raised papular and pink lesions on forearms and LE's, there is also a similar lesion on L shaft of penis w assoc excoriated lesion near corona of glans. No drainage, erythema, TTP, fluctuance, excoriation Psych: Age appropriate judgment and insight  Skin lesion - Plan: clobetasol cream (TEMOVATE) 0.05 %, mupirocin cream (BACTROBAN) 2 %  Screening for HIV (human immunodeficiency virus) - Plan: HIV Antibody (routine testing w rflx)  Encounter for hepatitis C screening test for low risk patient - Plan: Hepatitis C antibody  Routine screening for STI (sexually transmitted infection) - Plan: Urine cytology ancillary only([facility])  14 d of high potency steroid.  If no improvement, he will let us know and we will refer him to the dermatology team. F/u prn. The patient voiced understanding and agreement to the plan.  [place], DO 12/21/17 12:29 PM

## 2017-12-21 NOTE — Telephone Encounter (Signed)
PA denied for 30g, they will cover 15g.

## 2017-12-22 LAB — URINE CYTOLOGY ANCILLARY ONLY
Chlamydia: NEGATIVE
Neisseria Gonorrhea: NEGATIVE
Trichomonas: NEGATIVE

## 2017-12-22 LAB — HIV ANTIBODY (ROUTINE TESTING W REFLEX): HIV: NONREACTIVE

## 2017-12-22 LAB — HEPATITIS C ANTIBODY
Hepatitis C Ab: NONREACTIVE
SIGNAL TO CUT-OFF: 0.04 (ref <1.00)

## 2017-12-22 MED ORDER — MUPIROCIN CALCIUM 2 % EX CREA: 1.0000 "application " | TOPICAL_CREAM | Freq: Two times a day (BID) | CUTANEOUS | 0 refills | Status: AC

## 2017-12-22 NOTE — Telephone Encounter (Signed)
15 g cream tube called in to [facility]. If this needs to be sent to [facility] or any other pharmacy, [facility] to do. TY.

## 2017-12-22 NOTE — Addendum Note (Signed)
Addended by: Ames Coupe on: 12/22/2017 07:14 AM   Modules accepted: Orders

## 2017-12-22 NOTE — Telephone Encounter (Signed)
Patient informed. 

## 2017-12-24 ENCOUNTER — Encounter: Payer: Self-pay | Admitting: Family Medicine

## 2018-01-01 ENCOUNTER — Encounter: Payer: Self-pay | Admitting: Family Medicine

## 2018-01-02 ENCOUNTER — Other Ambulatory Visit: Payer: Self-pay | Admitting: Family Medicine

## 2018-01-02 DIAGNOSIS — K1370 Unspecified lesions of oral mucosa: Secondary | ICD-10-CM

## 2018-01-09 ENCOUNTER — Encounter: Payer: Self-pay | Admitting: Family Medicine

## 2018-01-09 ENCOUNTER — Other Ambulatory Visit: Payer: Self-pay | Admitting: Family Medicine

## 2018-01-09 ENCOUNTER — Ambulatory Visit (INDEPENDENT_AMBULATORY_CARE_PROVIDER_SITE_OTHER): Payer: 59 | Admitting: Family Medicine

## 2018-01-09 VITALS — BP 120/80 | HR 71 | Temp 98.2°F | Ht 67.0 in | Wt 169.4 lb

## 2018-01-09 DIAGNOSIS — Z0000 Encounter for general adult medical examination without abnormal findings: Secondary | ICD-10-CM | POA: Diagnosis not present

## 2018-01-09 DIAGNOSIS — K1370 Unspecified lesions of oral mucosa: Secondary | ICD-10-CM

## 2018-01-09 LAB — LIPID PANEL
Cholesterol: 170 mg/dL (ref 0–200)
HDL: 70.6 mg/dL (ref >39.00)
LDL Cholesterol: 89 mg/dL (ref 0–99)
NonHDL: 99.6
Total CHOL/HDL Ratio: 2
Triglycerides: 55 mg/dL (ref 0.0–149.0)
VLDL: 11 mg/dL (ref 0.0–40.0)

## 2018-01-09 LAB — COMPREHENSIVE METABOLIC PANEL
ALT: 27 U/L (ref 0–53)
AST: 32 U/L (ref 0–37)
Albumin: 4.5 g/dL (ref 3.5–5.2)
Alkaline Phosphatase: 50 U/L (ref 39–117)
BUN: 24 mg/dL — ABNORMAL HIGH (ref 6–23)
CO2: 28 mEq/L (ref 19–32)
Calcium: 9.3 mg/dL (ref 8.4–10.5)
Chloride: 105 mEq/L (ref 96–112)
Creatinine, Ser: 1.13 mg/dL (ref 0.40–1.50)
GFR: 73.6 mL/min (ref >60.00)
GLUCOSE: 90 mg/dL (ref 70–99)
Potassium: 4.1 mEq/L (ref 3.5–5.1)
SODIUM: 139 meq/L (ref 135–145)
Total Bilirubin: 1 mg/dL (ref 0.2–1.2)
Total Protein: 6.5 g/dL (ref 6.0–8.3)

## 2018-01-09 NOTE — Progress Notes (Signed)
Chief Complaint  Patient presents with  . Annual Exam    Well Male Travis Rose is here for a complete physical.   His last physical was >1 year ago.  Current diet: in general, a "healthy" diet.   Current exercise: lifting wts, cardio Weight trend: stable Daytime fatigue? No. Seat belt? Yes.    Health maintenance Tetanus- Yes HIV- Yes  Past Medical History:  Diagnosis Date  . Allergy      Past Surgical History:  Procedure Laterality Date  . NO PAST SURGERIES      Medications  Current Outpatient Medications on File Prior to Visit  Medication Sig Dispense Refill  . aspirin-acetaminophen-caffeine (EXCEDRIN MIGRAINE) 250-250-65 MG tablet Take by mouth.    Stasia Cavalier (EUCRISA) 2 % OINT Apply 1 application topically 2 (two) times daily as needed (to red itchy areas). 100 g 5  . ibuprofen (ADVIL) 200 MG tablet Take 200 mg by mouth every 6 (six) hours as needed.    . Naproxen Sodium (ALEVE PO) Take by mouth.    . nitroGLYCERIN (NITRODUR - DOSED IN MG/24 HR) 0.2 mg/hr patch Apply 1/4th patch to affected shoulder, change daily 30 patch 1  . Olopatadine HCl (PATADAY) 0.2 % SOLN One drop each eye once a day as needed for itchy eyes. 2.5 mL 5  . triamcinolone cream (KENALOG) 0.1 % Apply 1 application topically 2 (two) times daily. 30 g 0   Allergies No Known Allergies  Family History Family History  Problem Relation Age of Onset  . Allergic rhinitis Mother   . Allergic rhinitis Brother   . Asthma Brother   . Angioedema Neg Hx   . Eczema Neg Hx   . Immunodeficiency Neg Hx   . Urticaria Neg Hx     Review of Systems: Constitutional: no fevers or chills Eye:  no recent significant change in vision Ear/Nose/Mouth/Throat:  Ears:  no tinnitus or hearing loss Nose/Mouth/Throat:  no complaints of nasal congestion, no sore throat Cardiovascular:  no chest pain, no palpitations Respiratory:  no cough and no shortness of breath Gastrointestinal:  no abdominal pain, no change  in bowel habits GU:  Male: negative for dysuria, frequency, and incontinence and negative for prostate symptoms Musculoskeletal/Extremities:  no pain, redness, or swelling of the joints Integumentary (Skin/Breast): +lesions in mouth; otherwise no abnormal skin lesions reported Neurologic:  no headaches, no numbness, tingling Endocrine: No unexpected weight changes Hematologic/Lymphatic:  no night sweats  Exam BP 120/80 (BP Location: Left Arm, Patient Position: Sitting, Cuff Size: Normal)   Pulse 71   Temp 98.2 F (36.8 C) (Oral)   Ht 5\' 7"  (1.702 m)   Wt 169 lb 6 oz (76.8 kg)   SpO2 97%   BMI 26.53 kg/m  General:  well developed, well nourished, in no apparent distress Skin:  no significant moles, warts, or growths Head:  no masses, lesions, or tenderness Eyes:  pupils equal and round, sclera anicteric without injection Ears:  canals without lesions, TMs shiny without retraction, no obvious effusion, no erythema Nose:  nares patent, septum midline, mucosa normal Throat/Pharynx:  lips and gingiva without lesion; tongue and uvula midline; non-inflamed pharynx; no exudates or postnasal drainage Neck: neck supple without adenopathy, thyromegaly, or masses Lungs:  clear to auscultation, breath sounds equal bilaterally, no respiratory distress Cardio:  regular rate and rhythm, no bruits, no LE edema Abdomen:  abdomen soft, nontender; bowel sounds normal; no masses or organomegaly Genital (male): circumcised penis, no lesions or discharge; testes present bilaterally  without masses or tenderness Rectal: Deferred Musculoskeletal:  symmetrical muscle groups noted without atrophy or deformity Extremities:  no clubbing, cyanosis, or edema, no deformities, no skin discoloration Neuro:  gait normal; deep tendon reflexes normal and symmetric Psych: well oriented with normal range of affect and appropriate judgment/insight  Assessment and Plan  Well adult exam - Plan: Comprehensive metabolic  panel, Lipid panel   Well 48 y.o. male. Counseled on diet and exercise. He is doing well.  Other orders as above. Will refer to diff ENT per his request.  Follow up in 1 year pending the above workup. The patient voiced understanding and agreement to the plan.  [place], DO 01/09/18 9:15 AM

## 2018-01-09 NOTE — Progress Notes (Signed)
Pre visit review using our clinic review tool, if applicable. No additional management support is needed unless otherwise documented below in the visit note. 

## 2018-01-09 NOTE — Patient Instructions (Signed)
Give us 2-3 business days to get the results of your labs back.   Keep the diet clean and stay active.  Let us know if you need anything. 

## 2018-01-12 ENCOUNTER — Encounter: Payer: Self-pay | Admitting: Family Medicine

## 2018-01-19 DIAGNOSIS — L439 Lichen planus, unspecified: Secondary | ICD-10-CM | POA: Diagnosis not present

## 2018-01-19 DIAGNOSIS — Z87891 Personal history of nicotine dependence: Secondary | ICD-10-CM | POA: Diagnosis not present

## 2018-01-19 DIAGNOSIS — Z7289 Other problems related to lifestyle: Secondary | ICD-10-CM | POA: Diagnosis not present

## 2018-01-19 DIAGNOSIS — Z1389 Encounter for screening for other disorder: Secondary | ICD-10-CM | POA: Diagnosis not present

## 2018-01-26 DIAGNOSIS — K5900 Constipation, unspecified: Secondary | ICD-10-CM | POA: Diagnosis not present

## 2018-01-26 DIAGNOSIS — G4700 Insomnia, unspecified: Secondary | ICD-10-CM | POA: Diagnosis not present

## 2018-01-26 DIAGNOSIS — L309 Dermatitis, unspecified: Secondary | ICD-10-CM | POA: Diagnosis not present

## 2018-01-26 DIAGNOSIS — L439 Lichen planus, unspecified: Secondary | ICD-10-CM | POA: Diagnosis not present

## 2018-02-17 ENCOUNTER — Encounter: Payer: Self-pay | Admitting: Family Medicine

## 2018-02-21 MED ORDER — NITROGLYCERIN 0.2 MG/HR TD PT24: MEDICATED_PATCH | TRANSDERMAL | 0 refills | Status: DC

## 2018-02-22 ENCOUNTER — Ambulatory Visit (INDEPENDENT_AMBULATORY_CARE_PROVIDER_SITE_OTHER): Payer: 59 | Admitting: Family Medicine

## 2018-02-22 ENCOUNTER — Encounter: Payer: Self-pay | Admitting: Family Medicine

## 2018-02-22 VITALS — BP 108/68 | HR 65 | Temp 98.3°F | Ht 67.0 in | Wt 167.0 lb

## 2018-02-22 DIAGNOSIS — F418 Other specified anxiety disorders: Secondary | ICD-10-CM | POA: Diagnosis not present

## 2018-02-22 MED ORDER — SERTRALINE HCL 50 MG PO TABS: 50.0000 mg | ORAL_TABLET | Freq: Every day | ORAL | 3 refills | Status: DC

## 2018-02-22 NOTE — Patient Instructions (Addendum)
Keep the diet clean and stay active.  Please consider counseling. Contact [phone] to schedule an appointment or inquire about cost/insurance coverage.  Coping skills Choose 5 that work for you:  Take a deep breath  Count to 20  Read a book  Do a puzzle  Meditate  Bake  Sing  Knit  Garden  Pray  Go outside  Call a friend  Listen to music  Take a walk  Color  Send a note  Take a bath  Watch a movie  Be alone in a quiet place  Pet an animal  Visit a friend  Journal  Exercise  Stretch   Let us know if you need anything. 

## 2018-02-22 NOTE — Progress Notes (Signed)
Chief Complaint  Patient presents with  . Anxiety    Subjective: Patient is a 48 y.o. male here for anxiety/being on edge.  Pt has a hx of lichen planus, uncertain etiology. This is causing him anxiety. He always notes having high levels of stress, but this seems to be getting worse. He is hoping to start something that is not sedating. Not following with a counselor or psychologist.   Found he has food sensitivities to barley, peanuts, and gluten. Dairy does not do well with him.   ROS: Psych: +anxiety  Past Medical History:  Diagnosis Date  . Allergy     Objective: BP 108/68 (BP Location: Left Arm, Patient Position: Sitting, Cuff Size: Normal)   Pulse 65   Temp 98.3 F (36.8 C) (Oral)   Ht 5\' 7"  (1.702 m)   Wt 167 lb (75.8 kg)   SpO2 98%   BMI 26.16 kg/m  General: Awake, appears stated age Lungs: No accessory muscle use Psych: Age appropriate judgment and insight, normal affect and mood  Assessment and Plan: Situational anxiety - Plan: sertraline (ZOLOFT) 50 MG tablet  Orders as above. # for LB BH given. Continue exercise. F/u in 6 weeks. The patient voiced understanding and agreement to the plan.  [place], DO 02/22/18  11:53 AM

## 2018-03-16 ENCOUNTER — Other Ambulatory Visit: Payer: Self-pay | Admitting: Family Medicine

## 2018-03-16 ENCOUNTER — Encounter: Payer: Self-pay | Admitting: Family Medicine

## 2018-03-16 DIAGNOSIS — L439 Lichen planus, unspecified: Secondary | ICD-10-CM

## 2018-03-16 DIAGNOSIS — L438 Other lichen planus: Secondary | ICD-10-CM

## 2018-04-05 ENCOUNTER — Ambulatory Visit: Payer: 59 | Admitting: Family Medicine

## 2018-04-06 ENCOUNTER — Encounter: Payer: Self-pay | Admitting: Family Medicine

## 2018-04-07 ENCOUNTER — Encounter: Payer: Self-pay | Admitting: Family Medicine

## 2018-04-10 ENCOUNTER — Encounter: Payer: Self-pay | Admitting: Family Medicine

## 2018-04-10 ENCOUNTER — Ambulatory Visit (INDEPENDENT_AMBULATORY_CARE_PROVIDER_SITE_OTHER): Payer: 59 | Admitting: Family Medicine

## 2018-04-10 ENCOUNTER — Other Ambulatory Visit: Payer: Self-pay

## 2018-04-10 DIAGNOSIS — F411 Generalized anxiety disorder: Secondary | ICD-10-CM | POA: Diagnosis not present

## 2018-04-10 DIAGNOSIS — K1370 Unspecified lesions of oral mucosa: Secondary | ICD-10-CM | POA: Diagnosis not present

## 2018-04-10 DIAGNOSIS — L438 Other lichen planus: Secondary | ICD-10-CM | POA: Diagnosis not present

## 2018-04-10 MED ORDER — ALPRAZOLAM 0.5 MG PO TABS: 0.5000 mg | ORAL_TABLET | Freq: Every day | ORAL | 0 refills | Status: DC | PRN

## 2018-04-10 NOTE — Progress Notes (Signed)
Virtual Visit via Video Note  I connected with Travis Rose on 04/10/18 at 10:30 AM EDT by a video enabled telemedicine application and verified that I am speaking with the correct person using two identifiers.   I discussed the limitations of evaluation and management by telemedicine and the availability of in person appointments. The patient expressed understanding and agreed to proceed.  History of Present Illness: Stress levels continue to be high. OLP contributing. Does not follow with counseling or psychology. Failed Zoloft, no improvement and made him drowsy so he stopped taking it. A friend gave him some Xanax that he said helped. Also noted he does not want to take any medication long term. Father had issues with Ativan and alcohol.  OLP- has had biopsy confirming, tx's have not been helpful. Has been to alt medicine and had numerous tests completed that showed food sensitivities. No avoidance or tx has been helpful. Saw ENT here, was not happy with provider.    Observations/Objective: No conversational dyspnea Age appropriate judgment and insight Nml affect and mood  Assessment and Plan: Mouth lesion - Plan: Ambulatory referral to ENT  Oral lichen planus - Plan: Ambulatory referral to ENT  GAD (generalized anxiety disorder) - Plan: ALPRAZolam (XANAX) 0.5 MG tablet  Refer to [facility]NT for second opinion on dx and tx.  Stop Zoloft. Trial Xanax. He knows I do not want him using this to sleep or on a daily basis. Discussed counseling.   Follow Up Instructions: 1 mo   I discussed the assessment and treatment plan with the patient. The patient was provided an opportunity to ask questions and all were answered. The patient agreed with the plan and demonstrated an understanding of the instructions.   The patient was advised to call back or seek an in-person evaluation if the symptoms worsen or if the condition fails to improve as anticipated.  [place], DO

## 2018-05-01 DIAGNOSIS — L438 Other lichen planus: Secondary | ICD-10-CM | POA: Diagnosis not present

## 2018-07-11 ENCOUNTER — Ambulatory Visit: Payer: Self-pay

## 2018-07-11 ENCOUNTER — Encounter: Payer: Self-pay | Admitting: Family Medicine

## 2018-07-11 ENCOUNTER — Ambulatory Visit (INDEPENDENT_AMBULATORY_CARE_PROVIDER_SITE_OTHER): Payer: 59 | Admitting: Family Medicine

## 2018-07-11 ENCOUNTER — Other Ambulatory Visit: Payer: Self-pay

## 2018-07-11 VITALS — BP 107/75 | HR 87 | Ht 68.0 in | Wt 170.0 lb

## 2018-07-11 DIAGNOSIS — G8929 Other chronic pain: Secondary | ICD-10-CM

## 2018-07-11 DIAGNOSIS — M25512 Pain in left shoulder: Secondary | ICD-10-CM

## 2018-07-11 DIAGNOSIS — S43431A Superior glenoid labrum lesion of right shoulder, initial encounter: Secondary | ICD-10-CM | POA: Diagnosis not present

## 2018-07-11 DIAGNOSIS — M25511 Pain in right shoulder: Secondary | ICD-10-CM | POA: Diagnosis not present

## 2018-07-11 MED ORDER — IBUPROFEN-FAMOTIDINE 800-26.6 MG PO TABS: 1.0000 | ORAL_TABLET | Freq: Three times a day (TID) | ORAL | 3 refills | Status: DC

## 2018-07-11 NOTE — Patient Instructions (Signed)
Nice to meet you Please wear the sling on the right side for the next 3 weeks  Please try the rayos. Please try the duexis after this if you still have pain.  Please try ice on the right shoulder  Please try the exercises for the left shoulder.   Please send me a message in MyChart with any questions or updates.  Please see me back in 3 weeks.   --Dr. Raeford Razor

## 2018-07-11 NOTE — Progress Notes (Signed)
Travis Rose - 48 y.o. male MRN [id number]  Date of birth: July 09, 1970  SUBJECTIVE:  Including CC & ROS.  Chief Complaint  Patient presents with  . Follow-up    follow up for left shoulder  . Shoulder Injury    right shoulder x 07/08/2018    Travis Rose is a 48 y.o. male that is presenting with acute right shoulder pain and acute on chronic left shoulder pain.  This past weekend he was at his house and had an incident where his shoulder was in extension.  He felt his shoulder joint sublux.  Since that time he has had popping and pain.  The pain is localized to the shoulder and can be a 9 out of 10 with certain movements.  No history of prior pain.  The pain is sharp and localized to the shoulder.  The left shoulder is acute on chronic in nature.  The pain is mild to moderate.  It is localized to the shoulder.  He has done home exercises with some improvement.  He is tried nitroglycerin patches as well.  Pain is worse with certain overhead movements.   Review of Systems  Constitutional: Negative for fever.  HENT: Negative for congestion.   Respiratory: Negative for cough.   Cardiovascular: Negative for chest pain.  Gastrointestinal: Negative for abdominal pain.  Musculoskeletal: Positive for joint swelling.  Skin: Negative for color change.  Neurological: Negative for weakness.  Hematological: Negative for adenopathy.    HISTORY: Past Medical, Surgical, Social, and Family History Reviewed & Updated per EMR.   Pertinent Historical Findings include:  Past Medical History:  Diagnosis Date  . Allergy     Past Surgical History:  Procedure Laterality Date  . NO PAST SURGERIES      No Known Allergies  Family History  Problem Relation Age of Onset  . Allergic rhinitis Mother   . Allergic rhinitis Brother   . Asthma Brother   . Angioedema Neg Hx   . Eczema Neg Hx   . Immunodeficiency Neg Hx   . Urticaria Neg Hx      Social History   Socioeconomic History  .  Marital status: Single    Spouse name: Not on file  . Number of children: Not on file  . Years of education: Not on file  . Highest education level: Not on file  Occupational History  . Not on file  Social Needs  . Financial resource strain: Not on file  . Food insecurity    Worry: Not on file    Inability: Not on file  . Transportation needs    Medical: Not on file    Non-medical: Not on file  Tobacco Use  . Smoking status: Former Smoker    Packs/day: 0.50    Years: 5.00    Pack years: 2.50    Types: Cigarettes    Quit date: 01/04/2002    Years since quitting: 16.5  . Smokeless tobacco: Never Used  Substance and Sexual Activity  . Alcohol use: Yes    Alcohol/week: 2.0 standard drinks    Types: 2 Cans of beer per week  . Drug use: No  . Sexual activity: Not on file  Lifestyle  . Physical activity    Days per week: Not on file    Minutes per session: Not on file  . Stress: Not on file  Relationships  . Social Herbalist on phone: Not on file    Gets together: Not on file  Attends religious service: Not on file    Active member of club or organization: Not on file    Attends meetings of clubs or organizations: Not on file    Relationship status: Not on file  . Intimate partner violence    Fear of current or ex partner: Not on file    Emotionally abused: Not on file    Physically abused: Not on file    Forced sexual activity: Not on file  Other Topics Concern  . Not on file  Social History Narrative  . Not on file     PHYSICAL EXAM:  VS: BP 107/75   Pulse 87   Ht 5\' 8"  (1.727 m)   Wt 170 lb (77.1 kg)   BMI 25.85 kg/m  Physical Exam Gen: NAD, alert, cooperative with exam, well-appearing ENT: normal lips, normal nasal mucosa,  Eye: normal EOM, normal conjunctiva and lids CV:  no edema, +2 pedal pulses   Resp: no accessory muscle use, non-labored,   Skin: no rashes, no areas of induration  Neuro: normal tone, normal sensation to touch Psych:   normal insight, alert and oriented MSK:  Right shoulder: Normal active flexion abduction. Normal external rotation. Pain with external rotation and abduction. Mild pain with empty can testing. Positive O'Brien's test. Left shoulder. Normal range of motion. Normal external rotation. Mild pain with empty can testing. Some pain with O'Brien's testing. Neurovascular intact  Limited ultrasound: Right and left shoulder:  Right shoulder: Encircling effusion of the biceps tendon. Normal-appearing subscapularis. Normal-appearing supraspinatus. Posterior glenohumeral joint appears to have a mild effusion.  Left shoulder: Normal-appearing biceps tendon. Normal-appearing supraspinatus. No significant effusion in the posterior glenohumeral joint.  Summary: Findings suggest an intra-articular process such as a labral tear on the right.  Left shoulder showed no significant abnormalities  Ultrasound and interpretation by Clearance Coots, MD      ASSESSMENT & PLAN:   Left shoulder pain Pain is been ongoing despite conservative measures.  Possible for labral tear with ongoing pain. -Duexis. -Counseled on home exercise therapy and supportive care. -If no improvement consider glenohumeral injection or MRI to evaluate for labral tear.  Labral tear of shoulder, right, initial encounter Had an acute injury recently.  Findings on ultrasound and clinical exam would suggest an intra-articular process.  Possible for a mild subluxation of the joint itself. -Placed in a sling today. -Provided Rayos samples -Counseled on supportive care. -Follow-up in 3 weeks.  Would image and consider physical therapy or MRI at that time.

## 2018-07-12 ENCOUNTER — Other Ambulatory Visit: Payer: Self-pay | Admitting: Family Medicine

## 2018-07-12 DIAGNOSIS — S43431A Superior glenoid labrum lesion of right shoulder, initial encounter: Secondary | ICD-10-CM | POA: Insufficient documentation

## 2018-07-12 DIAGNOSIS — M25512 Pain in left shoulder: Secondary | ICD-10-CM | POA: Insufficient documentation

## 2018-07-12 NOTE — Assessment & Plan Note (Addendum)
Had an acute injury recently.  Findings on ultrasound and clinical exam would suggest an intra-articular process.  Possible for a mild subluxation of the joint itself. -Placed in a sling today. -Provided Rayos samples -Counseled on supportive care. -Follow-up in 3 weeks.  Would image and consider physical therapy or MRI at that time.

## 2018-07-12 NOTE — Assessment & Plan Note (Signed)
Pain is been ongoing despite conservative measures.  Possible for labral tear with ongoing pain. -Duexis. -Counseled on home exercise therapy and supportive care. -If no improvement consider glenohumeral injection or MRI to evaluate for labral tear.

## 2018-07-19 ENCOUNTER — Ambulatory Visit (INDEPENDENT_AMBULATORY_CARE_PROVIDER_SITE_OTHER): Payer: 59 | Admitting: Family Medicine

## 2018-07-19 ENCOUNTER — Other Ambulatory Visit: Payer: Self-pay

## 2018-07-19 ENCOUNTER — Encounter: Payer: Self-pay | Admitting: Family Medicine

## 2018-07-19 VITALS — BP 120/70 | Ht 68.0 in | Wt 170.0 lb

## 2018-07-19 DIAGNOSIS — M25511 Pain in right shoulder: Secondary | ICD-10-CM
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
## 2018-07-19 NOTE — Patient Instructions (Signed)
You have a shoulder subluxation. Try to avoid reaching, the position I showed you with the arm out to the side and overhead as much as possible. Icing 15 minutes at a time 3-4 times a day as needed. Aleve as needed. Theraband strengthening 3 sets of 10 once a day with yellow first - advance to red as tolerated. Stop the sling at this point if you can. If you're struggling let me know and we can refer you for physical therapy. Follow up with me in 1 month - we'll talk about this and your left shoulder again - I'd do the exercises on the left side as well.

## 2018-07-24 MED ORDER — METHOCARBAMOL 500 MG PO TABS: 500.0000 mg | ORAL_TABLET | Freq: Three times a day (TID) | ORAL | 1 refills | Status: DC | PRN

## 2018-07-27 ENCOUNTER — Encounter: Payer: Self-pay | Admitting: Family Medicine

## 2018-08-01 ENCOUNTER — Ambulatory Visit: Payer: 59 | Admitting: Family Medicine

## 2018-08-09 MED ORDER — DICLOFENAC SODIUM 75 MG PO TBEC: 75.0000 mg | DELAYED_RELEASE_TABLET | Freq: Two times a day (BID) | ORAL | 1 refills | Status: DC

## 2018-08-16 ENCOUNTER — Ambulatory Visit (INDEPENDENT_AMBULATORY_CARE_PROVIDER_SITE_OTHER): Payer: 59 | Admitting: Family Medicine

## 2018-08-16 ENCOUNTER — Other Ambulatory Visit: Payer: Self-pay

## 2018-08-16 ENCOUNTER — Encounter: Payer: Self-pay | Admitting: Family Medicine

## 2018-08-16 VITALS — BP 118/64

## 2018-08-16 DIAGNOSIS — M25511 Pain in right shoulder: Secondary | ICD-10-CM

## 2018-08-16 NOTE — Patient Instructions (Signed)
You had a shoulder subluxation. Icing 15 minutes at a time 3-4 times a day as needed. Diclofenac twice a day with food for pain and inflammation - take for 3-4 more weeks then as needed. Continue your home exercises but don't force full motion especially out to the side. Consider physical therapy, MRI arthrogram as next steps if not improving as expected. Follow up with me in 6 weeks.
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
## 2018-09-08 ENCOUNTER — Encounter: Payer: Self-pay | Admitting: Family Medicine

## 2018-09-08 DIAGNOSIS — F411 Generalized anxiety disorder: Secondary | ICD-10-CM

## 2018-09-08 MED ORDER — ALPRAZOLAM 0.5 MG PO TABS: 0.5000 mg | ORAL_TABLET | Freq: Every day | ORAL | 2 refills | Status: DC | PRN

## 2018-09-19 ENCOUNTER — Encounter: Payer: Self-pay | Admitting: Family Medicine

## 2018-10-10 DIAGNOSIS — M25511 Pain in right shoulder: Secondary | ICD-10-CM

## 2018-10-24 ENCOUNTER — Other Ambulatory Visit: Payer: Self-pay

## 2018-10-24 ENCOUNTER — Ambulatory Visit
Admission: RE | Admit: 2018-10-24 | Discharge: 2018-10-24 | Disposition: A | Discharge status: Home / Self Care | Payer: 59 | Source: Ambulatory Visit | Attending: Family Medicine | Admitting: Family Medicine

## 2018-10-24 DIAGNOSIS — M25511 Pain in right shoulder: Secondary | ICD-10-CM

## 2018-10-24 MED ORDER — IOPAMIDOL (ISOVUE-M 200) INJECTION 41%
12.0000 mL | Freq: Once | INTRAMUSCULAR | Status: AC
  Administered 2018-10-24: 12 mL via INTRA_ARTICULAR

## 2018-10-26 ENCOUNTER — Encounter: Payer: Self-pay | Admitting: Family Medicine

## 2018-10-26 ENCOUNTER — Other Ambulatory Visit: Payer: Self-pay

## 2018-10-26 DIAGNOSIS — M25511 Pain in right shoulder: Secondary | ICD-10-CM

## 2018-11-09 ENCOUNTER — Other Ambulatory Visit: Payer: Self-pay

## 2018-11-09 ENCOUNTER — Ambulatory Visit: Payer: 59 | Attending: Family Medicine | Admitting: Physical Therapy

## 2018-11-09 DIAGNOSIS — R293 Abnormal posture: Secondary | ICD-10-CM | POA: Diagnosis present

## 2018-11-09 DIAGNOSIS — M6281 Muscle weakness (generalized): Secondary | ICD-10-CM | POA: Insufficient documentation

## 2018-11-09 DIAGNOSIS — M25611 Stiffness of right shoulder, not elsewhere classified: Secondary | ICD-10-CM | POA: Diagnosis present

## 2018-11-09 DIAGNOSIS — M25511 Pain in right shoulder: Secondary | ICD-10-CM | POA: Diagnosis present
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
## 2018-11-09 NOTE — Patient Instructions (Signed)
    Home exercise program created by Abigaile Rossie, PT.  For questions, please contact Lindley Stachnik via phone at [phone] or email at [email or web address]  [facility] [place] [address]  [address] [place], [place], [address] Phone: [phone]   Fax:  [phone]    

## 2018-11-10 ENCOUNTER — Encounter: Payer: Self-pay | Admitting: Physical Therapy

## 2018-11-13 ENCOUNTER — Ambulatory Visit: Payer: 59 | Admitting: Physical Therapy

## 2018-11-13 ENCOUNTER — Encounter: Payer: Self-pay | Admitting: Physical Therapy

## 2018-11-13 ENCOUNTER — Other Ambulatory Visit: Payer: Self-pay

## 2018-11-13 DIAGNOSIS — R293 Abnormal posture: Secondary | ICD-10-CM

## 2018-11-13 DIAGNOSIS — M25611 Stiffness of right shoulder, not elsewhere classified: Secondary | ICD-10-CM

## 2018-11-13 DIAGNOSIS — M6281 Muscle weakness (generalized): Secondary | ICD-10-CM

## 2018-11-13 DIAGNOSIS — M25511 Pain in right shoulder: Secondary | ICD-10-CM

## 2018-11-13 NOTE — Therapy (Signed)
[facility] [place] [address]  [place], [place], [address] Phone: [phone]   Fax:  [phone]  Physical Therapy Treatment  Patient Details  Name: Travis Rose MRN: [id number] Date of Birth: 01/01/1971 Referring Provider (PT): Karlton Lemon, MD   Encounter Date: 11/13/2018  PT End of Session - 11/13/18 1533    Visit Number  2    Number of Visits  12    Date for PT Re-Evaluation  12/21/18    Authorization Type  UHC    Authorization - Number of Visits  60    PT Start Time  [id number]    PT Stop Time  1628    PT Time Calculation (min)  55 min    Activity Tolerance  Patient tolerated treatment well    Behavior During Therapy  [facility] for tasks assessed/performed       Past Medical History:  Diagnosis Date  . Allergy     Past Surgical History:  Procedure Laterality Date  . NO PAST SURGERIES      There were no vitals filed for this visit.  Subjective Assessment - 11/13/18 1535    Subjective  Pt reporting increased pain after the evaluation which seems to be resolving.    Pertinent History  remote h/o trauma to L shoulder    Diagnostic tests  10/24/18 - R shoulder MRI: 1. Large tear involving the superior, posterosuperior, and posteroinferior labrum; 2. Additional small tear of the anterior inferior labrum; 3. Mild supraspinatus tendinosis with tiny intrasubstance tear atthe insertion. No high-grade rotator cuff tear; 4. Full-thickness cartilage loss over the posterior humeral head.    Patient Stated Goals  "to get full strength & ROM back if possbility"    Currently in Pain?  No/denies    Pain Score  0-No pain   up to [id number] with certain movements   Pain Location  Shoulder    Pain Orientation  Right    Pain Descriptors / Indicators  Sharp    Pain Type  Chronic pain    Pain Onset  --   07/08/18   Pain Frequency  Intermittent                       [facility] Adult PT Treatment/Exercise - 11/13/18 1533       Exercises   Exercises  Shoulder      Shoulder Exercises: Standing   External Rotation  Right;10 reps;Theraband;Strengthening    Theraband Level (Shoulder External Rotation)  Level 1 (Yellow)    External Rotation Limitations  isometric step-outs - cues to maintain neutral shoulder & retract scapula    Internal Rotation  Right;10 reps;Theraband;Strengthening    Theraband Level (Shoulder Internal Rotation)  Level 1 (Yellow)    Internal Rotation Limitations  isometric step-outs - cues to maintain neutral shoulder & avoid trunk rotation    Row  Both;10 reps;Theraband;Strengthening    Theraband Level (Shoulder Row)  Level 1 (Yellow)    Row Limitations  verbal & tactile cues for scap retraction & depression avoiding shoulder shrug and/or forward rotation of shoulders      Shoulder Exercises: ROM/Strengthening   UBE (Upper Arm Bike)  L1.0 x 6 min (4' fwd/2' back)             PT Education - 11/13/18 1622    Education Details  HEP update - low doorway pec & posterior capsule stretches; yellow TB scapular depression & retraction    Person(s) Educated  Patient    Methods  Explanation;Demonstration;Handout    Comprehension  Verbalized understanding;Returned demonstration;Need further instruction       PT Short Term Goals - 11/13/18 1546      PT SHORT TERM GOAL #1   Title  Patient will be independent with initial HEP    Status  On-going    Target Date  11/23/18      PT SHORT TERM GOAL #2   Title  Patient to demonstrate appropriate posture and body mechanics needed for exercises and daily activities    Status  On-going    Target Date  11/23/18        PT Long Term Goals - 11/13/18 1546      PT LONG TERM GOAL #1   Title  Patient will be independent with ongoing/advanced HEP +/- gym program    Status  On-going    Target Date  12/21/18      PT LONG TERM GOAL #2   Title  R shoulder AROM WFL and essentially equivalent to L without pain provocation    Status  On-going     Target Date  12/21/18      PT LONG TERM GOAL #3   Title  R shoulder strength grossly >/= 4+/5 for improved functional use of R UE    Status  On-going    Target Date  12/21/18      PT LONG TERM GOAL #4   Title  Patient to report ability to perform ADLs, household and work-related tasks without increased R shoulder pain    Status  On-going    Target Date  12/21/18            Plan - 11/13/18 1548    Clinical Impression Statement  Travis Rose reporting some increased pain following initial assessment but states it seems to have subsided. Mild pain noted with yellow TB R shoulder IR isometric step-outs but better after correction of trunk and shoulder alignment. Patient also requiring correction of technique for ER isometric step-out to ensure scapular stabilization as well as cues to avoid shoulder shrugs and/or anterior tilt/rotation of shoulders during HEP review but denies pain with these exercises. R shoulder remains notably elevated as compared to L, therefore focused on further scapular retraction and depression activities with HEP updated accordingly. STM revealing ttp and taut bands in anterior shoulder and pecs as well as posterior inferior shoulder complex, therefore provided instructions in gentle stretching for anterior and posterior shoulder emphasizing need to avoid pain ROM/movement.    Personal Factors and Comorbidities  Time since onset of injury/illness/exacerbation    Rehab Potential  Good    PT Frequency  2x / week    PT Duration  6 weeks    PT Treatment/Interventions  ADLs/Self Care Home Management;Cryotherapy;Electrical Stimulation;Iontophoresis 4mg /ml Dexamethasone;Moist Heat;Ultrasound;Therapeutic activities;Therapeutic exercise;Neuromuscular re-education;Patient/family education;Manual techniques;Passive range of motion;Dry needling;Taping;Joint Manipulations;Vasopneumatic Device    PT Next Visit Plan  R shoulder AA/AROM and light strengthening; manual therapy to address  abnormal muscle tension; modalities PRN    PT Home Exercise Plan  11/09/18 - UT stretch, yellow TB rows and IR/ER isometric step-outs; 11/13/18 - low doorway pec & posterior capsule stretches; yellow TB scapular depression & retraction    Consulted and Agree with Plan of Care  Patient       Patient will benefit from skilled therapeutic intervention in order to improve the following deficits and impairments:  Decreased activity tolerance, Decreased range of motion, Decreased strength, Increased muscle spasms, Impaired perceived functional ability,  Impaired flexibility, Impaired UE functional use, Improper body mechanics, Postural dysfunction, Pain  Visit Diagnosis: Acute pain of right shoulder  Stiffness of right shoulder, not elsewhere classified  Muscle weakness (generalized)  Abnormal posture     Problem List Patient Active Problem List   Diagnosis Date Noted  . Left shoulder pain 07/12/2018  . Labral tear of shoulder, right, initial encounter 07/12/2018  . Oral lichen planus [id number]  . GAD (generalized anxiety disorder) 04/10/2018  . Seasonal allergic rhinitis 11/24/2017  . Allergic conjunctivitis 11/24/2017  . Mouth lesion 11/24/2017  . Eczema 10/28/2017  . Right arm pain 04/25/2017    Percival Spanish, PT, MPT 11/13/2018, 5:57 PM  [facility] [address]  [place], [place], [address] Phone: [phone]   Fax:  [phone]  Name: Travis Rose MRN: [id number] Date of Birth: 1970/09/24

## 2018-11-13 NOTE — Patient Instructions (Signed)
    Home exercise program created by Kaori Jumper, PT.  For questions, please contact Emnet Monk via phone at [phone] or email at [email or web address]  [facility] [place] [address]  [address] [place], [place], [address] Phone: [phone]   Fax:  [phone]    

## 2018-11-15 ENCOUNTER — Ambulatory Visit: Payer: 59

## 2018-11-15 ENCOUNTER — Other Ambulatory Visit: Payer: Self-pay

## 2018-11-15 DIAGNOSIS — M25611 Stiffness of right shoulder, not elsewhere classified: Secondary | ICD-10-CM

## 2018-11-15 DIAGNOSIS — M6281 Muscle weakness (generalized): Secondary | ICD-10-CM

## 2018-11-15 DIAGNOSIS — M25511 Pain in right shoulder: Secondary | ICD-10-CM

## 2018-11-15 DIAGNOSIS — R293 Abnormal posture: Secondary | ICD-10-CM

## 2018-11-15 NOTE — Therapy (Signed)
[place] [address]  [place], [place], [address] Phone: [phone]   Fax:  [phone]  Physical Therapy Treatment  Patient Details  Name: Travis Rose MRN: [id number] Date of Birth: 20-Jul-1970 Referring Provider (PT): Karlton Lemon, MD   Encounter Date: 11/15/2018  PT End of Session - 11/15/18 0807    Visit Number  3    Number of Visits  12    Date for PT Re-Evaluation  12/21/18    Authorization Type  UHC    Authorization - Number of Visits  60    PT Start Time  0802    PT Stop Time  0844    PT Time Calculation (min)  42 min    Activity Tolerance  Patient tolerated treatment well    Behavior During Therapy  [facility] for tasks assessed/performed       Past Medical History:  Diagnosis Date  . Allergy     Past Surgical History:  Procedure Laterality Date  . NO PAST SURGERIES      There were no vitals filed for this visit.  Subjective Assessment - 11/15/18 0805    Subjective  Pt. reporting reaching into back seat in car and "sleeping on the shouldre wrong".    Pertinent History  remote h/o trauma to L shoulder    Diagnostic tests  10/24/18 - R shoulder MRI: 1. Large tear involving the superior, posterosuperior, and posteroinferior labrum; 2. Additional small tear of the anterior inferior labrum; 3. Mild supraspinatus tendinosis with tiny intrasubstance tear atthe insertion. No high-grade rotator cuff tear; 4. Full-thickness cartilage loss over the posterior humeral head.    Patient Stated Goals  "to get full strength & ROM back if possbility"    Currently in Pain?  No/denies    Pain Score  0-No pain   Pain up to 4/10 at worst   Pain Location  Shoulder    Pain Orientation  Right    Pain Descriptors / Indicators  Dull;Radiating    Pain Type  Chronic pain    Pain Radiating Towards  radiates down into R biceps at times    Aggravating Factors   reaching into back seat in car    Multiple Pain Sites  No                        [facility] Adult PT Treatment/Exercise - 11/15/18 0001      Self-Care   Self-Care  Other Self-Care Comments    Other Self-Care Comments   Instructed/ demo'd self- ball release on wall to posterior inferior shoulder and rhomboids x 1 min       Shoulder Exercises: Supine   Protraction  Right;15 reps;Weights    Protraction Weight (lbs)  2      Shoulder Exercises: Standing   External Rotation  Right;15 reps;Strengthening;Theraband    Theraband Level (Shoulder External Rotation)  Level 1 (Yellow)    External Rotation Limitations  isometric step-outs - cues to maintain neutral shoulder & retract scapula    Internal Rotation  Right;15 reps;Theraband;Strengthening    Theraband Level (Shoulder Internal Rotation)  Level 1 (Yellow)    Internal Rotation Limitations  isometric step-outs - cues to maintain neutral shoulder & avoid trunk rotation    Extension  10 reps;Both;Strengthening;Theraband    Theraband Level (Shoulder Extension)  Level 1 (Yellow)    Extension Limitations  Cues to avoid elbows behind back     Row  Both;10  reps;Theraband;Strengthening    Theraband Level (Shoulder Row)  Level 1 (Yellow)    Row Limitations  cues to avoid elbows behind back       Shoulder Exercises: ROM/Strengthening   UBE (Upper Arm Bike)  L1.5 x 6 min (4' fwd/2' back)               PT Short Term Goals - 11/13/18 1546      PT SHORT TERM GOAL #1   Title  Patient will be independent with initial HEP    Status  On-going    Target Date  11/23/18      PT SHORT TERM GOAL #2   Title  Patient to demonstrate appropriate posture and body mechanics needed for exercises and daily activities    Status  On-going    Target Date  11/23/18        PT Long Term Goals - 11/13/18 1546      PT LONG TERM GOAL #1   Title  Patient will be independent with ongoing/advanced HEP +/- gym program    Status  On-going    Target Date  12/21/18      PT LONG TERM GOAL #2   Title  R  shoulder AROM WFL and essentially equivalent to L without pain provocation    Status  On-going    Target Date  12/21/18      PT LONG TERM GOAL #3   Title  R shoulder strength grossly >/= 4+/5 for improved functional use of R UE    Status  On-going    Target Date  12/21/18      PT LONG TERM GOAL #4   Title  Patient to report ability to perform ADLs, household and work-related tasks without increased R shoulder pain    Status  On-going    Target Date  12/21/18            Plan - 11/15/18 1331    Clinical Impression Statement  Davidson denies soreness after last session.  Focused session on periscapular strengthening and mild progression of yellow TB-resisted iso step outs for ER/IR.  Pt. able to progression without increased pain and seems to be less tender in posterior/inferior shoulder today with MT.  Did require cueing for proper scapular positioning with therex throughout today however pt. awareness of scapular positioning improved by end of session.    Personal Factors and Comorbidities  Time since onset of injury/illness/exacerbation    Rehab Potential  Good    PT Treatment/Interventions  ADLs/Self Care Home Management;Cryotherapy;Electrical Stimulation;Iontophoresis 4mg /ml Dexamethasone;Moist Heat;Ultrasound;Therapeutic activities;Therapeutic exercise;Neuromuscular re-education;Patient/family education;Manual techniques;Passive range of motion;Dry needling;Taping;Joint Manipulations;Vasopneumatic Device    PT Next Visit Plan  R shoulder AA/AROM and light strengthening; manual therapy to address abnormal muscle tension; modalities PRN    PT Home Exercise Plan  11/09/18 - UT stretch, yellow TB rows and IR/ER isometric step-outs; 11/13/18 - low doorway pec & posterior capsule stretches; yellow TB scapular depression & retraction    Consulted and Agree with Plan of Care  Patient       Patient will benefit from skilled therapeutic intervention in order to improve the following deficits and  impairments:  Decreased activity tolerance, Decreased range of motion, Decreased strength, Increased muscle spasms, Impaired perceived functional ability, Impaired flexibility, Impaired UE functional use, Improper body mechanics, Postural dysfunction, Pain  Visit Diagnosis: Acute pain of right shoulder  Stiffness of right shoulder, not elsewhere classified  Muscle weakness (generalized)  Abnormal posture     Problem List  Patient Active Problem List   Diagnosis Date Noted  . Left shoulder pain 07/12/2018  . Labral tear of shoulder, right, initial encounter 07/12/2018  . Oral lichen planus [id number]  . GAD (generalized anxiety disorder) 04/10/2018  . Seasonal allergic rhinitis 11/24/2017  . Allergic conjunctivitis 11/24/2017  . Mouth lesion 11/24/2017  . Eczema 10/28/2017  . Right arm pain 04/25/2017    Bess Harvest, PTA 11/15/18 1:32 PM   [facility] [address]  [place], [place], [address] Phone: [phone]   Fax:  [phone]  Name: Breyton Sepanski MRN: [id number] Date of Birth: February 08, 1970

## 2018-11-20 ENCOUNTER — Other Ambulatory Visit: Payer: Self-pay

## 2018-11-20 ENCOUNTER — Encounter: Payer: Self-pay | Admitting: Physical Therapy

## 2018-11-20 ENCOUNTER — Ambulatory Visit: Payer: 59 | Admitting: Physical Therapy

## 2018-11-20 DIAGNOSIS — M6281 Muscle weakness (generalized): Secondary | ICD-10-CM

## 2018-11-20 DIAGNOSIS — M25511 Pain in right shoulder: Secondary | ICD-10-CM | POA: Diagnosis not present

## 2018-11-20 DIAGNOSIS — M25611 Stiffness of right shoulder, not elsewhere classified: Secondary | ICD-10-CM

## 2018-11-20 DIAGNOSIS — R293 Abnormal posture: Secondary | ICD-10-CM

## 2018-11-20 NOTE — Therapy (Signed)
[place] [address]  [place], [place], [address] Phone: [phone]   Fax:  [phone]  Physical Therapy Treatment  Patient Details  Name: Travis Rose MRN: [id number] Date of Birth: [birth year] Referring Provider (PT): Karlton Lemon, MD   Encounter Date: 11/20/2018  PT End of Session - 11/20/18 1531    Visit Number  4    Number of Visits  12    Date for PT Re-Evaluation  12/21/18    Authorization Type  UHC    Authorization - Number of Visits  60    PT Start Time  [id number]    PT Stop Time  1627    PT Time Calculation (min)  56 min    Activity Tolerance  Patient tolerated treatment well    Behavior During Therapy  [facility] for tasks assessed/performed       Past Medical History:  Diagnosis Date  . Allergy     Past Surgical History:  Procedure Laterality Date  . NO PAST SURGERIES      There were no vitals filed for this visit.  Subjective Assessment - 11/20/18 1534    Subjective  Pt painfree today but notes increased pain after running yesterday.    Pertinent History  remote h/o trauma to L shoulder    Diagnostic tests  10/24/18 - R shoulder MRI: 1. Large tear involving the superior, posterosuperior, and posteroinferior labrum; 2. Additional small tear of the anterior inferior labrum; 3. Mild supraspinatus tendinosis with tiny intrasubstance tear atthe insertion. No high-grade rotator cuff tear; 4. Full-thickness cartilage loss over the posterior humeral head.    Patient Stated Goals  "to get full strength & ROM back if possbility"    Currently in Pain?  No/denies         [facility] PT Assessment - 11/20/18 1531      Assessment   Medical Diagnosis  Acute pain of R shoulder d/t R shoulder subluxation with extensive labral tear    Referring Provider (PT)  Karlton Lemon, MD    Onset Date/Surgical Date  07/08/18    Hand Dominance  Right    Next MD Visit  ~11/28/18 with Marchia Bond, MD   pt planning to  cancel appt     AROM   Right Shoulder Flexion  160 Degrees    Right Shoulder ABduction  150 Degrees                   [facility] Adult PT Treatment/Exercise - 11/20/18 1531      Exercises   Exercises  Shoulder      Shoulder Exercises: Prone   Extension  Both;10 reps;AROM;Strengthening    Extension Limitations  I's over green Pball    External Rotation  Both;10 reps;AROM;Strengthening    External Rotation Limitations  W's over green Pball    Horizontal ABduction 1  Both;10 reps;AROM;Strengthening    Horizontal ABduction 1 Limitations  T's over green Pball    Horizontal ABduction 2  Both;10 reps;AROM;Strengthening    Horizontal ABduction 2 Limitations  Y's over green Pball      Shoulder Exercises: Standing   Protraction  Right;15 reps;Theraband;Strengthening    Theraband Level (Shoulder Protraction)  Level 1 (Yellow)      Shoulder Exercises: Therapy Ball   Flexion  Right;15 reps    Flexion Limitations  green Pball on wall + gentle stretch at top of motion      Shoulder Exercises: ROM/Strengthening   UBE (Upper Arm  Bike)  L2.0 x 6 min (3' fwd/3' back)    Wall Pushups  10 reps    Wall Pushups Limitations  serratus press - excessive scapular winging noted B    Ball on Wall  R scapular retraction + CW/CCW shoulder circles at 90 dg flexion 2 x10 each way      Modalities   Modalities  Vasopneumatic      Vasopneumatic   Number Minutes Vasopneumatic   10 minutes    Vasopnuematic Location   Shoulder   Right   Vasopneumatic Pressure  Low    Vasopneumatic Temperature   34 dg               PT Short Term Goals - 11/20/18 1536      PT SHORT TERM GOAL #1   Title  Patient will be independent with initial HEP    Status  Achieved    Target Date  11/23/18      PT SHORT TERM GOAL #2   Title  Patient to demonstrate appropriate posture and body mechanics needed for exercises and daily activities    Status  On-going    Target Date  11/23/18        PT Long Term  Goals - 11/13/18 1546      PT LONG TERM GOAL #1   Title  Patient will be independent with ongoing/advanced HEP +/- gym program    Status  On-going    Target Date  12/21/18      PT LONG TERM GOAL #2   Title  R shoulder AROM WFL and essentially equivalent to L without pain provocation    Status  On-going    Target Date  12/21/18      PT LONG TERM GOAL #3   Title  R shoulder strength grossly >/= 4+/5 for improved functional use of R UE    Status  On-going    Target Date  12/21/18      PT LONG TERM GOAL #4   Title  Patient to report ability to perform ADLs, household and work-related tasks without increased R shoulder pain    Status  On-going    Target Date  12/21/18            Plan - 11/20/18 1627    Clinical Impression Statement  Amar noting improving functional overhead ROM with R shoulder with flexion ROM essentially symmetrical to L other than tightness noted at end ROM. Significant scapular winging evident during UE weight bearing exercises, therefore majority of session focusing on continued scapular stabilization/strengthening with good tolerance noted - will plan to review new exercises next visit then update HEP as appropriate.    Personal Factors and Comorbidities  Time since onset of injury/illness/exacerbation    Rehab Potential  Good    PT Treatment/Interventions  ADLs/Self Care Home Management;Cryotherapy;Electrical Stimulation;Iontophoresis 4mg /ml Dexamethasone;Moist Heat;Ultrasound;Therapeutic activities;Therapeutic exercise;Neuromuscular re-education;Patient/family education;Manual techniques;Passive range of motion;Dry needling;Taping;Joint Manipulations;Vasopneumatic Device    PT Next Visit Plan  scapular stabilization/strengthening; R shoulder AA/AROM and light strengthening; manual therapy to address abnormal muscle tension; modalities PRN    PT Home Exercise Plan  11/09/18 - UT stretch, yellow TB rows and IR/ER isometric step-outs; 11/13/18 - low doorway pec &  posterior capsule stretches; yellow TB scapular depression & retraction    Consulted and Agree with Plan of Care  Patient       Patient will benefit from skilled therapeutic intervention in order to improve the following deficits and impairments:  Decreased activity tolerance,  Decreased range of motion, Decreased strength, Increased muscle spasms, Impaired perceived functional ability, Impaired flexibility, Impaired UE functional use, Improper body mechanics, Postural dysfunction, Pain  Visit Diagnosis: Acute pain of right shoulder  Stiffness of right shoulder, not elsewhere classified  Muscle weakness (generalized)  Abnormal posture     Problem List Patient Active Problem List   Diagnosis Date Noted  . Left shoulder pain 07/12/2018  . Labral tear of shoulder, right, initial encounter 07/12/2018  . Oral lichen planus [id number]  . GAD (generalized anxiety disorder) 04/10/2018  . Seasonal allergic rhinitis 11/24/2017  . Allergic conjunctivitis 11/24/2017  . Mouth lesion 11/24/2017  . Eczema 10/28/2017  . Right arm pain 04/25/2017    Percival Spanish, PT, MPT 11/20/2018, 8:01 PM  [facility] [address]  [address] [place] [place], [place], [address] Phone: [phone]   Fax:  [phone]  Name: Brayam Garsee MRN: [id number] Date of Birth: 06-Sep-1970

## 2018-11-23 ENCOUNTER — Other Ambulatory Visit: Payer: Self-pay

## 2018-11-23 ENCOUNTER — Encounter: Payer: Self-pay | Admitting: Physical Therapy

## 2018-11-23 ENCOUNTER — Ambulatory Visit: Payer: 59 | Admitting: Physical Therapy

## 2018-11-23 DIAGNOSIS — M25611 Stiffness of right shoulder, not elsewhere classified: Secondary | ICD-10-CM

## 2018-11-23 DIAGNOSIS — M25511 Pain in right shoulder: Secondary | ICD-10-CM | POA: Diagnosis not present

## 2018-11-23 DIAGNOSIS — R293 Abnormal posture: Secondary | ICD-10-CM

## 2018-11-23 DIAGNOSIS — M6281 Muscle weakness (generalized): Secondary | ICD-10-CM

## 2018-11-23 NOTE — Patient Instructions (Signed)
    Home exercise program created by JoAnne Kreis, PT.  For questions, please contact JoAnne via phone at [phone] or email at [email or web address]  [facility] [place] [address]  [address] [place], [place], [address] Phone: [phone]   Fax:  [phone]    

## 2018-11-23 NOTE — Therapy (Signed)
[facility] [address]  [place], [place], [address] Phone: [phone]   Fax:  [phone]  Physical Therapy Treatment  Patient Details  Name: Travis Rose MRN: [id number] Date of Birth: May 03, 1970 Referring Provider (PT): Karlton Lemon, MD   Encounter Date: 11/23/2018  PT End of Session - 11/23/18 1445    Visit Number  5    Number of Visits  12    Date for PT Re-Evaluation  12/21/18    Authorization Type  [employer]    Authorization - Number of Visits  60    PT Start Time  [id number]    PT Stop Time  1544    PT Time Calculation (min)  59 min    Activity Tolerance  Patient tolerated treatment well    Behavior During Therapy  [facility] for tasks assessed/performed       Past Medical History:  Diagnosis Date  . Allergy     Past Surgical History:  Procedure Laterality Date  . NO PAST SURGERIES      There were no vitals filed for this visit.  Subjective Assessment - 11/23/18 1447    Subjective  Pt reporting some increased pain/soreness after attempting resisted cruches at the gym yesterday. Pt attributes most of his recent pain to his sleeping positions.    Pertinent History  remote h/o trauma to L shoulder    Diagnostic tests  10/24/18 - R shoulder MRI: 1. Large tear involving the superior, posterosuperior, and posteroinferior labrum; 2. Additional small tear of the anterior inferior labrum; 3. Mild supraspinatus tendinosis with tiny intrasubstance tear atthe insertion. No high-grade rotator cuff tear; 4. Full-thickness cartilage loss over the posterior humeral head.    Patient Stated Goals  "to get full strength & ROM back if possbility"    Currently in Pain?  No/denies                       [facility] Adult PT Treatment/Exercise - 11/23/18 1445      Exercises   Exercises  Shoulder      Shoulder Exercises: Prone   Extension  Both;10 reps;AROM;Strengthening    Extension Limitations  I's over green Pball     External Rotation  Both;10 reps;AROM;Strengthening    External Rotation Limitations  W's over green Pball    Horizontal ABduction 1  Both;10 reps;AROM;Strengthening    Horizontal ABduction 1 Limitations  T's over green Pball    Horizontal ABduction 2  Both;10 reps;AROM;Strengthening    Horizontal ABduction 2 Limitations  Y's over green Pball      Shoulder Exercises: Standing   External Rotation  Right;15 reps;Strengthening;Theraband    Theraband Level (Shoulder External Rotation)  Level 1 (Yellow)    External Rotation Limitations  neutral shoulder with towel under elbow - cues for scap retraction    Internal Rotation  Right;15 reps;Theraband;Strengthening;AROM    Theraband Level (Shoulder Internal Rotation)  Level 1 (Yellow)    Internal Rotation Limitations  neutral shoulder with towel under elbow - cues to avoid rounding shoulder forward    Extension  10 reps;Both;Strengthening;Theraband    Theraband Level (Shoulder Extension)  Level 1 (Yellow)    Extension Limitations  cues to emphasize scap retraction + mini-shoulder extension    Row  Both;10 reps;Theraband;Strengthening    Theraband Level (Shoulder Row)  Level 1 (Yellow)    Row Limitations  cues to avoid upward rotation of elbows behind back  Shoulder Exercises: Therapy Ball   Flexion  Right;15 reps    Flexion Limitations  green Pball on wall + gentle stretch at top of motion      Shoulder Exercises: ROM/Strengthening   UBE (Upper Arm Bike)  L2.0 x 6 min (3' fwd/3' back)    Wall Pushups  15 reps    Wall Pushups Limitations  serratus press - excessive scapular winging noted B    Ball on Wall  R scapular retraction + up/down, side/side, CW/CCW shoulder circles at 90 dg flexion x10 each way    Other ROM/Strengthening Exercises  Serrratus roll-up on 6" FR with yellow TB iosmetric x 10      Modalities   Modalities  Vasopneumatic      Vasopneumatic   Number Minutes Vasopneumatic   10 minutes    Vasopnuematic Location    Shoulder   Right   Vasopneumatic Pressure  Medium    Vasopneumatic Temperature   34 dg             PT Education - 11/23/18 1539    Education Details  HEP update - serratus press & yellow TB isometric roll-up; I's/T's/Y's/W's over Pball, ball on wall shoulder stabilization; shoulder IR/ER progressed to AROM with yellow TB (pt instructed to alternate days with existing HEP)       PT Short Term Goals - 11/20/18 1536      PT SHORT TERM GOAL #1   Title  Patient will be independent with initial HEP    Status  Achieved    Target Date  11/23/18      PT SHORT TERM GOAL #2   Title  Patient to demonstrate appropriate posture and body mechanics needed for exercises and daily activities    Status  On-going    Target Date  11/23/18        PT Long Term Goals - 11/13/18 1546      PT LONG TERM GOAL #1   Title  Patient will be independent with ongoing/advanced HEP +/- gym program    Status  On-going    Target Date  12/21/18      PT LONG TERM GOAL #2   Title  R shoulder AROM WFL and essentially equivalent to L without pain provocation    Status  On-going    Target Date  12/21/18      PT LONG TERM GOAL #3   Title  R shoulder strength grossly >/= 4+/5 for improved functional use of R UE    Status  On-going    Target Date  12/21/18      PT LONG TERM GOAL #4   Title  Patient to report ability to perform ADLs, household and work-related tasks without increased R shoulder pain    Status  On-going    Target Date  12/21/18            Plan - 11/23/18 1451    Clinical Impression Statement  Travis Rose reporting pain typically only related to sleeping positions but did note increased pain after jump rope and resisted crunches with UE pull-down at the gym yesterday. He reports good tolerance for scapular stabilization progression last visit, therefore reviewed these exercises and updated HEP accordingly. Also able to progress shoulder rows and retraction/extension to red TB and shoulder  IR/ER from isometric to AROM with yellow TB however cues required to ensure neutral shoulder alignment. Treatment concluded with vasopnuematic compression as patient felt that this helped minimize post-exercise muscle soreness after last visit.  Personal Factors and Comorbidities  Time since onset of injury/illness/exacerbation    Rehab Potential  Good    PT Frequency  2x / week    PT Duration  6 weeks    PT Treatment/Interventions  ADLs/Self Care Home Management;Cryotherapy;Electrical Stimulation;Iontophoresis 4mg /ml Dexamethasone;Moist Heat;Ultrasound;Therapeutic activities;Therapeutic exercise;Neuromuscular re-education;Patient/family education;Manual techniques;Passive range of motion;Dry needling;Taping;Joint Manipulations;Vasopneumatic Device    PT Next Visit Plan  scapular stabilization/strengthening; R shoulder AA/AROM and light strengthening; manual therapy to address abnormal muscle tension; modalities PRN    PT Home Exercise Plan  11/09/18 - UT stretch, yellow TB rows and IR/ER isometric step-outs; 11/13/18 - low doorway pec & posterior capsule stretches; yellow TB scapular depression & retraction; 11/23/18 - serratus press & yellow TB isometric roll-up; I's/T's/Y's/W's over Pball, ball on wall shoulder stabilization, shoulder IR/ER progressed to AROM with yellow TB and rows/retraction progressed to red TB    Consulted and Agree with Plan of Care  Patient       Patient will benefit from skilled therapeutic intervention in order to improve the following deficits and impairments:  Decreased activity tolerance, Decreased range of motion, Decreased strength, Increased muscle spasms, Impaired perceived functional ability, Impaired flexibility, Impaired UE functional use, Improper body mechanics, Postural dysfunction, Pain  Visit Diagnosis: Acute pain of right shoulder  Stiffness of right shoulder, not elsewhere classified  Muscle weakness (generalized)  Abnormal posture     Problem  List Patient Active Problem List   Diagnosis Date Noted  . Left shoulder pain 07/12/2018  . Labral tear of shoulder, right, initial encounter 07/12/2018  . Oral lichen planus [id number]  . GAD (generalized anxiety disorder) 04/10/2018  . Seasonal allergic rhinitis 11/24/2017  . Allergic conjunctivitis 11/24/2017  . Mouth lesion 11/24/2017  . Eczema 10/28/2017  . Right arm pain 04/25/2017    Percival Spanish, PT, MPT 11/23/2018, 3:54 PM  [facility] [address]  [place], [place], [address] Phone: [phone]   Fax:  [phone]  Name: Travis Rose MRN: [id number] Date of Birth: [birth year]

## 2018-11-27 ENCOUNTER — Ambulatory Visit: Payer: 59

## 2018-11-27 ENCOUNTER — Other Ambulatory Visit: Payer: Self-pay

## 2018-11-27 DIAGNOSIS — M25511 Pain in right shoulder: Secondary | ICD-10-CM

## 2018-11-27 DIAGNOSIS — M6281 Muscle weakness (generalized): Secondary | ICD-10-CM

## 2018-11-27 DIAGNOSIS — M25611 Stiffness of right shoulder, not elsewhere classified: Secondary | ICD-10-CM

## 2018-11-27 DIAGNOSIS — R293 Abnormal posture: Secondary | ICD-10-CM

## 2018-11-27 NOTE — Therapy (Addendum)
[facility] [address]  [place], [place], [address] Phone: [phone]   Fax:  [phone]  Physical Therapy Treatment / Discharge Summary  Patient Details  Name: Travis Rose MRN: [id number] Date of Birth: 1970/09/11 Referring Provider (PT): Karlton Lemon, MD   Encounter Date: 11/27/2018  PT End of Session - 11/27/18 1455    Visit Number  6    Number of Visits  12    Date for PT Re-Evaluation  12/21/18    Authorization Type  [employer]HC    Authorization - Number of Visits  60    PT Start Time  7001    PT Stop Time  1534    PT Time Calculation (min)  46 min    Activity Tolerance  Patient tolerated treatment well    Behavior During Therapy  [facility] for tasks assessed/performed       Past Medical History:  Diagnosis Date  . Allergy     Past Surgical History:  Procedure Laterality Date  . NO PAST SURGERIES      There were no vitals filed for this visit.  Subjective Assessment - 11/27/18 1451    Subjective  pt. noting he has been trying to go "easy" at the gym.    Pertinent History  remote h/o trauma to L shoulder    Diagnostic tests  10/24/18 - R shoulder MRI: 1. Large tear involving the superior, posterosuperior, and posteroinferior labrum; 2. Additional small tear of the anterior inferior labrum; 3. Mild supraspinatus tendinosis with tiny intrasubstance tear atthe insertion. No high-grade rotator cuff tear; 4. Full-thickness cartilage loss over the posterior humeral head.    Patient Stated Goals  "to get full strength & ROM back if possbility"    Currently in Pain?  No/denies    Pain Score  0-No pain   pain rising up to 7/10 at times        [facility] PT Assessment - 11/27/18 0001      AROM   AROM Assessment Site  Shoulder    Right/Left Shoulder  Right    Right Shoulder Flexion  160 Degrees    Right Shoulder ABduction  156 Degrees    Right Shoulder Internal Rotation  --   FIR to L1   Right Shoulder  External Rotation  --   FER to T4     Strength   Strength Assessment Site  Shoulder    Right/Left Shoulder  Right;Left    Right Shoulder Flexion  4+/5    Right Shoulder ABduction  4/5    Right Shoulder Internal Rotation  4/5    Right Shoulder External Rotation  4/5    Left Shoulder Flexion  4+/5    Left Shoulder ABduction  4/5    Left Shoulder Internal Rotation  4+/5    Left Shoulder External Rotation  4+/5                   [facility] Adult PT Treatment/Exercise - 11/27/18 0001      Shoulder Exercises: Supine   Protraction  15 reps;Weights;Both    Protraction Weight (lbs)  4      Shoulder Exercises: Standing   External Rotation  Right;15 reps;Strengthening;Theraband    Theraband Level (Shoulder External Rotation)  Level 1 (Yellow)    External Rotation Limitations  neutral shoulder with towel under elbow - cues for scap retraction    Internal Rotation  Right;15 reps;Theraband;Strengthening;AROM    Theraband Level (Shoulder Internal Rotation)  Level 1 (Yellow)    Internal Rotation Limitations  neutral shoulder with towel under elbow - cues to avoid rounding shoulder forward    Retraction  10 reps    Retraction Limitations  5" scapular retraction at doorseal       Shoulder Exercises: ROM/Strengthening   UBE (Upper Arm Bike)  L2.0 x 6 min (3' fwd/3' back)    Wall Pushups  15 reps    Wall Pushups Limitations  serratus press - excessive scapular winging noted B      Shoulder Exercises: Stretch   Other Shoulder Stretches  Hooklying chest stretch on 1/2 foam x 60 sec              PT Education - 11/27/18 1602    Education Details  Pt. issue internal rotation towel stretch - instructed to push "very easy" to avoid excessive labral strain    Person(s) Educated  Patient    Methods  Explanation;Demonstration;Verbal cues;Handout    Comprehension  Verbalized understanding;Returned demonstration;Verbal cues required       PT Short Term Goals - 11/27/18 1817      PT  SHORT TERM GOAL #1   Title  Patient will be independent with initial HEP    Status  Achieved    Target Date  11/23/18      PT SHORT TERM GOAL #2   Title  Patient to demonstrate appropriate posture and body mechanics needed for exercises and daily activities    Status  Achieved    Target Date  11/23/18        PT Long Term Goals - 11/27/18 1819      PT LONG TERM GOAL #1   Title  Patient will be independent with ongoing/advanced HEP +/- gym program    Status  On-going      PT LONG TERM GOAL #2   Title  R shoulder AROM WFL and essentially equivalent to L without pain provocation    Status  Partially Met   11/27/18: met for flexion     PT LONG TERM GOAL #3   Title  R shoulder strength grossly >/= 4+/5 for improved functional use of R UE    Status  Partially Met      PT LONG TERM GOAL #4   Title  Patient to report ability to perform ADLs, household and work-related tasks without increased R shoulder pain    Status  On-going            Plan - 11/27/18 1516    Clinical Impression Statement  Pt. has made good progress with physical therapy.  Pt. reporting he has now adjusted his gym exercises to avoid undue R shoulder strain which has recently improved his R shoulder comfort.  STG #1 met.  Reports he is performing therapy HEP daily.  Pt. able to demo more normalized R shoulder AROM as compared to L with flexion now nearly symmetrical R/L.  LTG #2 partially achieved.  Able to demo much improved R shoulder strength now grossly 4/5-4+/5 strength with MMT with pain on mod/max resistance into abduction, ER.  LTG #3 partially achieved.  Pt. still noting difficulty/pain raising arm overhead putting up clothes at home and with other household chores thus LTG #4 ongoing.  Session focused on continued progress into R shoulder AROM yellow TB RTC strengthening and tactile/verbal cueing for proper scapular mechanics with wall pushups as pt. continues with B scapular "winging".  Pt. does seem to be  improving awareness of scapular mechanics  with therex and pain overall reported at 50% improvement since start of therapy.    Personal Factors and Comorbidities  Time since onset of injury/illness/exacerbation    Rehab Potential  Good    PT Treatment/Interventions  ADLs/Self Care Home Management;Cryotherapy;Electrical Stimulation;Iontophoresis 10m/ml Dexamethasone;Moist Heat;Ultrasound;Therapeutic activities;Therapeutic exercise;Neuromuscular re-education;Patient/family education;Manual techniques;Passive range of motion;Dry needling;Taping;Joint Manipulations;Vasopneumatic Device    PT Next Visit Plan  scapular stabilization/strengthening; R shoulder AA/AROM and light strengthening; manual therapy to address abnormal muscle tension; modalities PRN    PT Home Exercise Plan  11/09/18 - UT stretch, yellow TB rows and IR/ER isometric step-outs; 11/13/18 - low doorway pec & posterior capsule stretches; yellow TB scapular depression & retraction; 11/23/18 - serratus press & yellow TB isometric roll-up; I's/T's/Y's/W's over Pball, ball on wall shoulder stabilization, shoulder IR/ER progressed to AROM with yellow TB and rows/retraction progressed to red TB;  11/27/18 - behind back (gentle) towel IR stretch    Consulted and Agree with Plan of Care  Patient       Patient will benefit from skilled therapeutic intervention in order to improve the following deficits and impairments:  Decreased activity tolerance, Decreased range of motion, Decreased strength, Increased muscle spasms, Impaired perceived functional ability, Impaired flexibility, Impaired UE functional use, Improper body mechanics, Postural dysfunction, Pain  Visit Diagnosis: Acute pain of right shoulder  Stiffness of right shoulder, not elsewhere classified  Muscle weakness (generalized)  Abnormal posture     Problem List Patient Active Problem List   Diagnosis Date Noted  . Left shoulder pain 07/12/2018  . Labral tear of shoulder,  right, initial encounter 07/12/2018  . Oral lichen planus [id number] . GAD (generalized anxiety disorder) 04/10/2018  . Seasonal allergic rhinitis 11/24/2017  . Allergic conjunctivitis 11/24/2017  . Mouth lesion 11/24/2017  . Eczema 10/28/2017  . Right arm pain 04/25/2017    MBess Harvest PTA 11/27/18 6:26 PM   [facility] [address] [place] [address]Phone: [phone]  Fax:  [phone] Name: GJedediah NodaMRN: [id number] of Birth: [birth year]  PHYSICAL THERAPY DISCHARGE SUMMARY  Visits from Start of Care: 6  Current functional level related to goals / functional outcomes:   Refer to above clinical impression for status as of last visit on 11/27/2018. Patient failed to schedule any further visits in >30 days and was unable to be reached when attempted to contact him to schedule further visits, therefore will proceed with discharge from PT for this episode.   Remaining deficits:   As above. Unable to formally assess status at discharge due to failure to return.   Education / Equipment:   HEP  Plan: Patient agrees to discharge.  Patient goals were partially met. Patient is being discharged due to not returning since the last visit.  ?????     JPercival Spanish PT, MPT 01/04/19, 10:05 AM  [facility] [place] [address]Phone: [phone]  Fax:  [phone]

## 2019-01-15 ENCOUNTER — Other Ambulatory Visit: Payer: Self-pay

## 2019-01-16 ENCOUNTER — Other Ambulatory Visit: Payer: Self-pay

## 2019-01-16 ENCOUNTER — Ambulatory Visit (INDEPENDENT_AMBULATORY_CARE_PROVIDER_SITE_OTHER): Payer: 59 | Admitting: Family Medicine

## 2019-01-16 ENCOUNTER — Encounter: Payer: Self-pay | Admitting: Family Medicine

## 2019-01-16 VITALS — BP 104/64 | HR 63 | Temp 97.0°F | Ht 67.0 in | Wt 169.0 lb

## 2019-01-16 DIAGNOSIS — R6882 Decreased libido: Secondary | ICD-10-CM

## 2019-01-16 DIAGNOSIS — R0989 Other specified symptoms and signs involving the circulatory and respiratory systems: Secondary | ICD-10-CM

## 2019-01-16 DIAGNOSIS — H9192 Unspecified hearing loss, left ear: Secondary | ICD-10-CM | POA: Diagnosis not present

## 2019-01-16 LAB — T4, FREE: Free T4: 1.01 ng/dL (ref 0.60–1.60)

## 2019-01-16 LAB — TSH: TSH: 1.11 u[IU]/mL (ref 0.35–4.50)

## 2019-01-16 LAB — COMPREHENSIVE METABOLIC PANEL
ALT: 20 U/L (ref 0–53)
AST: 21 U/L (ref 0–37)
Albumin: 4.3 g/dL (ref 3.5–5.2)
Alkaline Phosphatase: 43 U/L (ref 39–117)
BUN: 15 mg/dL (ref 6–23)
CO2: 29 mEq/L (ref 19–32)
Calcium: 9.1 mg/dL (ref 8.4–10.5)
Chloride: 104 mEq/L (ref 96–112)
Creatinine, Ser: 1.19 mg/dL (ref 0.40–1.50)
GFR: 64.96 mL/min (ref >60.00)
Glucose, Bld: 85 mg/dL (ref 70–99)
Potassium: 4 mEq/L (ref 3.5–5.1)
Sodium: 139 mEq/L (ref 135–145)
Total Bilirubin: 0.8 mg/dL (ref 0.2–1.2)
Total Protein: 6.2 g/dL (ref 6.0–8.3)

## 2019-01-16 LAB — CBC
HCT: 42 % (ref 39.0–52.0)
Hemoglobin: 14.2 g/dL (ref 13.0–17.0)
MCHC: 33.8 g/dL (ref 30.0–36.0)
MCV: 94.3 fl (ref 78.0–100.0)
Platelets: 178 10*3/uL (ref 150.0–400.0)
RBC: 4.46 Mil/uL (ref 4.22–5.81)
RDW: 12.9 % (ref 11.5–15.5)
WBC: 3.6 10*3/uL — ABNORMAL LOW (ref 4.0–10.5)

## 2019-01-16 LAB — TESTOSTERONE: Testosterone: 576.73 ng/dL (ref 300.00–890.00)

## 2019-01-16 NOTE — Progress Notes (Signed)
Chief Complaint  Patient presents with  . Follow-up    check testosterone    Subjective: Patient is a 49 y.o. male here for several issues.  Patient is always felt he had more fat accumulation in his pectoral areas.  He had imaging several years ago that confirmed he does not have gynecomastia and he denies any fat accumulation behind the nipples.  He is not having any lumps/bumps, nor is he having any pain.  He is concerned he may be having low testosterone levels and would like some of his blood work checked.  No fatigue or other weight gain.  He states this is getting worse with age.  Unsure if any male family members have similar issues.  Patient is been having worsening hearing in his left ear.  He is interested in seeing an ear nose and throat physician of his ears have no wax.  He tried to wax removal at home with minimal success.  There is no pain, fullness, or drainage.  ROS: Heart: Denies chest pain  HEENT: +hearing loss  Past Medical History:  Diagnosis Date  . Allergy     Objective: BP 104/64 (BP Location: Left Arm, Patient Position: Sitting, Cuff Size: Normal)   Pulse 63   Temp (!) 97 F (36.1 C) (Temporal)   Ht '5\' 7"'  (1.702 m)   Wt 169 lb (76.7 kg)   SpO2 97%   BMI 26.47 kg/m  General: Awake, appears stated age HEENT: R ear neg, L TM neg, canal w 70% cerumen obstruction Heart: RRR Skin: I do feel adipose over the lateral pectoral bilaterally.  It does not feel like breast tissue and there is no tenderness or discrete mass.  There is no redness, fluctuance, ecchymosis, or excessive warmth. Lungs: CTAB, no rales, wheezes or rhonchi. No accessory muscle use Psych: Age appropriate judgment and insight, normal affect and mood  Assessment and Plan: Low libido - Plan: TSH, T4, free, Comp Met (CMET), CBC, Testosterone  Hearing loss of left ear, unspecified hearing loss type  Chest wall symptom or complaint  1/3-check labs today.  I do think this may be more of his  genetic predisposition.  If his labs are normal, I will refer him to an endocrinologist for their opinion. 2-earwax flush today, if he does not notice any improvement in next few days, he will send me a message and we will refer him to the ENT team. Follow-up as originally scheduled. The patient voiced understanding and agreement to the plan.  [place], DO 01/16/19  11:53 AM

## 2019-01-16 NOTE — Patient Instructions (Addendum)
If you do not hear anything about your referral in the next 1-2 weeks, call our office and ask for an update.  Give [place] 2-3 business days to get the results of your labs back.   If labs are normal, we can set you up with the endocrinology team.    Let us know if you need anything.

## 2019-01-17 ENCOUNTER — Other Ambulatory Visit: Payer: Self-pay | Admitting: Family Medicine

## 2019-01-17 DIAGNOSIS — R0989 Other specified symptoms and signs involving the circulatory and respiratory systems: Secondary | ICD-10-CM

## 2019-01-19 ENCOUNTER — Other Ambulatory Visit: Payer: Self-pay

## 2019-01-19 MED ORDER — NITROGLYCERIN 0.2 MG/HR TD PT24: MEDICATED_PATCH | TRANSDERMAL | 0 refills | Status: DC

## 2019-02-14 ENCOUNTER — Other Ambulatory Visit: Payer: Self-pay

## 2019-02-16 ENCOUNTER — Encounter: Payer: Self-pay | Admitting: Endocrinology

## 2019-02-16 ENCOUNTER — Ambulatory Visit (INDEPENDENT_AMBULATORY_CARE_PROVIDER_SITE_OTHER): Payer: 59 | Admitting: Endocrinology

## 2019-02-16 ENCOUNTER — Other Ambulatory Visit: Payer: Self-pay

## 2019-02-16 DIAGNOSIS — N62 Hypertrophy of breast: Secondary | ICD-10-CM | POA: Diagnosis not present

## 2019-02-16 MED ORDER — TAMOXIFEN CITRATE 10 MG PO TABS: 10.0000 mg | ORAL_TABLET | Freq: Every day | ORAL | 3 refills | Status: DC

## 2019-02-16 NOTE — Patient Instructions (Addendum)
Blood tests are requested for you today.  We'll let you know about the results.   I have sent a prescription to your pharmacy, for this.     Please see a plastic surgery specialist.  you will receive a phone call, about a day and time for an appointment.

## 2019-02-16 NOTE — Progress Notes (Signed)
Subjective:    Patient ID: Travis Rose, male    DOB: 11-25-1970, 49 y.o.   MRN: [id number]  HPI Pt is referred by Dr Nani Ravens, for gynecomastia.  Pt was the product of a normal pregnancy and delivery.  he had puberty at the normal age.  He has never taken illicit androgens.  He has no biological children. He has never been diagnosed with hypogonadism.  He denies any h/o infertility.  He has never had liver disease, kidney disease, cancer, cystic fibrosis, ulcerative colitis, alcoholism, or BPH.  He has never taken cimetidine, calcium channel blockers, growth hormone, risperidone, hCG, opioids, androgens, 5-alpha-reductase inhibitors, cancer chemotherapy, estrogens, or ketoconazole.  He now reports of moderate swelling at both breasts, and slight assoc pain.   Past Medical History:  Diagnosis Date  . Allergy     Past Surgical History:  Procedure Laterality Date  . NO PAST SURGERIES      Social History   Socioeconomic History  . Marital status: Single    Spouse name: Not on file  . Number of children: Not on file  . Years of education: Not on file  . Highest education level: Not on file  Occupational History  . Not on file  Tobacco Use  . Smoking status: Former Smoker    Packs/day: 0.50    Years: 5.00    Pack years: 2.50    Types: Cigarettes    Quit date: 01/04/2002    Years since quitting: 17.1  . Smokeless tobacco: Never Used  Substance and Sexual Activity  . Alcohol use: Yes    Alcohol/week: 2.0 standard drinks    Types: 2 Cans of beer per week  . Drug use: No  . Sexual activity: Not on file  Other Topics Concern  . Not on file  Social History Narrative  . Not on file   Social Determinants of Health   Financial Resource Strain:   . Difficulty of Paying Living Expenses: Not on file  Food Insecurity:   . Worried About Charity fundraiser in the Last Year: Not on file  . Ran Out of Food in the Last Year: Not on file  Transportation Needs:   . Lack of  Transportation (Medical): Not on file  . Lack of Transportation (Non-Medical): Not on file  Physical Activity:   . Days of Exercise per Week: Not on file  . Minutes of Exercise per Session: Not on file  Stress:   . Feeling of Stress : Not on file  Social Connections:   . Frequency of Communication with Friends and Family: Not on file  . Frequency of Social Gatherings with Friends and Family: Not on file  . Attends Religious Services: Not on file  . Active Member of Clubs or Organizations: Not on file  . Attends Archivist Meetings: Not on file  . Marital Status: Not on file  Intimate Partner Violence:   . Fear of Current or Ex-Partner: Not on file  . Emotionally Abused: Not on file  . Physically Abused: Not on file  . Sexually Abused: Not on file    Current Outpatient Medications on File Prior to Visit  Medication Sig Dispense Refill  . ALPRAZolam (XANAX) 0.5 MG tablet Take 1 tablet (0.5 mg total) by mouth daily as needed for anxiety. 30 tablet 2  . Crisaborole (EUCRISA) 2 % OINT Apply 1 application topically 2 (two) times daily as needed (to red itchy areas). 100 g 5  . diclofenac (VOLTAREN) 75  MG EC tablet Take 1 tablet (75 mg total) by mouth 2 (two) times daily. (Patient taking differently: Take 75 mg by mouth as needed. ) 60 tablet 1  . ibuprofen (ADVIL) 200 MG tablet Take 200 mg by mouth every 6 (six) hours as needed.    . meloxicam (MOBIC) 15 MG tablet Take 15 mg by mouth daily.    . methocarbamol (ROBAXIN) 500 MG tablet Take 1 tablet (500 mg total) by mouth every 8 (eight) hours as needed. 60 tablet 1  . nitroGLYCERIN (NITRODUR - DOSED IN MG/24 HR) 0.2 mg/hr patch Apply 1/4th patch to affected shoulder, change daily 30 patch 0   No current facility-administered medications on file prior to visit.    No Known Allergies  Family History  Problem Relation Age of Onset  . Allergic rhinitis Mother   . Allergic rhinitis Brother   . Asthma Brother   . Angioedema Neg  Hx   . Eczema Neg Hx   . Immunodeficiency Neg Hx   . Urticaria Neg Hx   . Other Neg Hx        gynecomastia    BP 100/60 (BP Location: Right Arm, Patient Position: Sitting, Cuff Size: Normal)   Pulse 60   Ht 5\' 7"  (1.702 m)   Wt 168 lb 9.6 oz (76.5 kg)   SpO2 98%   BMI 26.41 kg/m   Review of Systems denies numbness, erectile dysfunction, weight change, decreased urinary stream, headache, rash, and blurry vision.       Objective:   Physical Exam VS: see vs page GEN: no distress HEAD: head: no deformity.  Male pattern alopecia.   eyes: no periorbital swelling, no proptosis external nose and ears are normal NECK: supple, thyroid is not enlarged CHEST WALL: no deformity LUNGS: clear to auscultation BREASTS:  Mod bilat pseudogynecomastia, but no palpable ductal tissue CV: reg rate and rhythm, no murmur.  GENITALIA:  Normal male.   MUSCULOSKELETAL: muscle bulk and strength are grossly normal.  no obvious joint swelling.  gait is normal and steady.  EXTEMITIES: no deformity is seen.  No leg edema PULSES: no carotid bruit NEURO:  cn 2-12 grossly intact.   readily moves all 4's.  sensation is intact to touch on all 4's SKIN:  Normal texture and temperature.  No rash or suspicious lesion is visible.  Normal body hair distribution.   NODES:  None palpable at the neck PSYCH: alert, well-oriented.  Does not appear anxious nor depressed.    Lab Results  Component Value Date   TSH 1.11 01/16/2019   Lab Results  Component Value Date   TESTOSTERONE 576.73 01/16/2019   Pt says he had mammography in 2011--no gynecomastia.    I have reviewed outside records, and summarized: Pt was noted to have elevated a1c, and referred here.  Low libido, hearing loss, and chest wall pain were also addressed      Assessment & Plan:  Gynecomastia/pseudogynecomastia, new to me, uncertain etiology   Patient Instructions  Blood tests are requested for you today.  We'll let you know about the  results.   I have sent a prescription to your pharmacy, for this.     Please see a plastic surgery specialist.  you will receive a phone call, about a day and time for an appointment.

## 2019-02-22 LAB — ESTRADIOL, FREE
Estradiol, Free: 0.5 pg/mL — ABNORMAL HIGH
Estradiol: 24 pg/mL

## 2019-02-22 LAB — HCG, TOTAL, QUANTITATIVE: hCG, Beta Chain, Quant, S: <3 m[IU]/mL (ref <5)

## 2019-02-25 ENCOUNTER — Encounter: Payer: Self-pay | Admitting: Endocrinology

## 2019-02-26 ENCOUNTER — Encounter: Payer: Self-pay | Admitting: Endocrinology

## 2019-03-01 ENCOUNTER — Encounter: Payer: Self-pay | Admitting: Family Medicine

## 2019-03-01 DIAGNOSIS — H9192 Unspecified hearing loss, left ear: Secondary | ICD-10-CM

## 2019-03-15 ENCOUNTER — Encounter: Payer: Self-pay | Admitting: Family Medicine

## 2019-03-15 DIAGNOSIS — F411 Generalized anxiety disorder: Secondary | ICD-10-CM

## 2019-03-15 MED ORDER — ALPRAZOLAM 0.5 MG PO TABS: 0.5000 mg | ORAL_TABLET | Freq: Every day | ORAL | 2 refills | Status: DC | PRN

## 2019-03-15 NOTE — Telephone Encounter (Signed)
Requesting:xanax Contract:no UDS:no Last OV:01/16/19 Next OV:n/a Last Refill:09/08/18 #[id number] Database:   Please advise

## 2019-04-03 ENCOUNTER — Encounter: Payer: Self-pay | Admitting: Plastic Surgery

## 2019-04-03 ENCOUNTER — Ambulatory Visit (INDEPENDENT_AMBULATORY_CARE_PROVIDER_SITE_OTHER): Payer: 59 | Admitting: Plastic Surgery

## 2019-04-03 ENCOUNTER — Other Ambulatory Visit: Payer: Self-pay

## 2019-04-03 VITALS — BP 118/73 | HR 68 | Temp 98.7°F | Ht 68.0 in | Wt 165.6 lb

## 2019-04-03 DIAGNOSIS — N62 Hypertrophy of breast: Secondary | ICD-10-CM | POA: Diagnosis not present

## 2019-04-03 NOTE — Progress Notes (Signed)
Patient ID: Travis Rose, male    DOB: [birth year], 49 y.o.   MRN: [id number]   Chief Complaint  Patient presents with  . Advice Only    gynecomastia    The patient is a 49 year old white male here for evaluation of his breasts.  The patient is very astute and his health and working out.  He noticed for years she had fullness in his breast area.  He started working out the chest muscles even more.  He did not see an improvement.  This concern led him to his physician and he underwent full lab work evaluation.  Fortunately that all looks good.  The only one that was slightly elevated was his free estradiol.  On exam he has some fullness in the breast area.  But once I isolate the pectoralis major muscle from the fat it became clear that the majority was his very strong pectoralis muscle.  He does have a little bit of excess fat over it but the majority is actually his muscle.  The nipple areola to inframammary fold distance is slightly shortened which may be accentuating the appearance of gynecomastia.  There is no illicit drug use and no pharmacologic use that would explain the fullness.  No lumps bumps or concerning areas are noted in the breasts.  I have personally reviewed the labs from January.   Review of Systems  Constitutional: Negative for activity change and appetite change.  HENT: Negative.   Eyes: Negative.   Respiratory: Negative.  Negative for chest tightness and shortness of breath.   Cardiovascular: Negative for leg swelling.  Gastrointestinal: Negative for abdominal distention and abdominal pain.  Endocrine: Negative.   Genitourinary: Negative.   Musculoskeletal: Negative.   Hematological: Negative.   Psychiatric/Behavioral: Negative.     Past Medical History:  Diagnosis Date  . Allergy     Past Surgical History:  Procedure Laterality Date  . NO PAST SURGERIES        Current Outpatient Medications:  .  ALPRAZolam (XANAX) 0.5 MG tablet, Take 1 tablet (0.5 mg  total) by mouth daily as needed for anxiety., Disp: 30 tablet, Rfl: 2 .  Crisaborole (EUCRISA) 2 % OINT, Apply 1 application topically 2 (two) times daily as needed (to red itchy areas)., Disp: 100 g, Rfl: 5 .  diclofenac (VOLTAREN) 75 MG EC tablet, Take 1 tablet (75 mg total) by mouth 2 (two) times daily. (Patient taking differently: Take 75 mg by mouth as needed. ), Disp: 60 tablet, Rfl: 1 .  ibuprofen (ADVIL) 200 MG tablet, Take 200 mg by mouth every 6 (six) hours as needed., Disp: , Rfl:  .  meloxicam (MOBIC) 15 MG tablet, Take 15 mg by mouth daily., Disp: , Rfl:  .  methocarbamol (ROBAXIN) 500 MG tablet, Take 1 tablet (500 mg total) by mouth every 8 (eight) hours as needed., Disp: 60 tablet, Rfl: 1 .  nitroGLYCERIN (NITRODUR - DOSED IN MG/24 HR) 0.2 mg/hr patch, Apply 1/4th patch to affected shoulder, change daily, Disp: 30 patch, Rfl: 0 .  tamoxifen (NOLVADEX) 10 MG tablet, Take 1 tablet (10 mg total) by mouth daily., Disp: 90 tablet, Rfl: 3   Objective:   Vitals:   04/03/19 0916  BP: 118/73  Pulse: 68  Temp: 98.7 F (37.1 C)  SpO2: 100%    Physical Exam Vitals and nursing note reviewed.  Constitutional:      Appearance: Normal appearance.  HENT:     Head: Normocephalic and atraumatic.  Eyes:  Extraocular Movements: Extraocular movements intact.  Cardiovascular:     Rate and Rhythm: Normal rate.     Pulses: Normal pulses.  Pulmonary:     Effort: Pulmonary effort is normal. No respiratory distress.  Skin:    General: Skin is warm.  Neurological:     General: No focal deficit present.     Mental Status: He is alert and oriented to person, place, and time.  Psychiatric:        Mood and Affect: Mood normal.        Behavior: Behavior normal.        Thought Content: Thought content normal.     Assessment & Plan:  Gynecomastia  We discussed options for treatment which include surgical excision, liposuction and the combination of the 2.  There is the possibility of  cool sculpting which would be considered nonsurgical.  I have given him Dr. Lowell Bouton contact information at the office to seek more information.  This might be an alternative for him.  We will certainly give him a quote for the liposuction and talk further as needed.  Overall I say he looks like a normal individual who is in good health and works out. Pictures were obtained of the patient and placed in the chart with the patient's or guardian's permission.  Wallace Going, DO   The 21st Century Cures Act was signed into law in 2016 which includes the topic of electronic health records.  This provides immediate access to information in MyChart.  This includes consultation notes, operative notes, office notes, lab results and pathology reports.  If you have any questions about what you read please let us know at your next visit or call us at the office.  We are right here with you.

## 2019-04-21 MED ORDER — MELOXICAM 15 MG PO TABS: 15.0000 mg | ORAL_TABLET | Freq: Every day | ORAL | 2 refills | Status: DC

## 2019-06-07 ENCOUNTER — Encounter: Payer: Self-pay | Admitting: Plastic Surgery

## 2019-06-13 ENCOUNTER — Encounter: Payer: Self-pay | Admitting: Family Medicine

## 2019-06-13 ENCOUNTER — Ambulatory Visit (INDEPENDENT_AMBULATORY_CARE_PROVIDER_SITE_OTHER): Payer: 59 | Admitting: Family Medicine

## 2019-06-13 ENCOUNTER — Other Ambulatory Visit: Payer: Self-pay

## 2019-06-13 ENCOUNTER — Ambulatory Visit
Admission: RE | Admit: 2019-06-13 | Discharge: 2019-06-13 | Disposition: A | Discharge status: Home / Self Care | Payer: 59 | Source: Ambulatory Visit | Attending: Family Medicine | Admitting: Family Medicine

## 2019-06-13 VITALS — BP 102/68 | Ht 68.0 in | Wt 160.0 lb

## 2019-06-13 DIAGNOSIS — M25562 Pain in left knee: Secondary | ICD-10-CM

## 2019-06-13 NOTE — Progress Notes (Signed)
PCP: Shelda Pal, DO  Subjective:   HPI: Patient is a 49 y.o. male here for left knee pain.  Patient reports about 1-2 months ago he was doing high knees on a treadmill, came down with left foot and felt a pain deep in left knee. He kept running on the treadmill but knee hasn't felt the same since Since that time unable to jog without feeling deep pain, heaviness within left knee afterwards. Bothers more with lateral movement. Tried mobic, topical medications without much benefit. No catching, locking.  Past Medical History:  Diagnosis Date  . Allergy     Current Outpatient Medications on File Prior to Visit  Medication Sig Dispense Refill  . ALPRAZolam (XANAX) 0.5 MG tablet Take 1 tablet (0.5 mg total) by mouth daily as needed for anxiety. 30 tablet 2  . Crisaborole (EUCRISA) 2 % OINT Apply 1 application topically 2 (two) times daily as needed (to red itchy areas). 100 g 5  . diclofenac (VOLTAREN) 75 MG EC tablet Take 1 tablet (75 mg total) by mouth 2 (two) times daily. (Patient taking differently: Take 75 mg by mouth as needed. ) 60 tablet 1  . ibuprofen (ADVIL) 200 MG tablet Take 200 mg by mouth every 6 (six) hours as needed.    . meloxicam (MOBIC) 15 MG tablet Take 1 tablet (15 mg total) by mouth daily. 30 tablet 2  . methocarbamol (ROBAXIN) 500 MG tablet Take 1 tablet (500 mg total) by mouth every 8 (eight) hours as needed. 60 tablet 1  . nitroGLYCERIN (NITRODUR - DOSED IN MG/24 HR) 0.2 mg/hr patch Apply 1/4th patch to affected shoulder, change daily 30 patch 0  . tamoxifen (NOLVADEX) 10 MG tablet Take 1 tablet (10 mg total) by mouth daily. 90 tablet 3   No current facility-administered medications on file prior to visit.    Past Surgical History:  Procedure Laterality Date  . NO PAST SURGERIES      No Known Allergies  Social History   Socioeconomic History  . Marital status: Single    Spouse name: Not on file  . Number of children: Not on file  . Years  of education: Not on file  . Highest education level: Not on file  Occupational History  . Not on file  Tobacco Use  . Smoking status: Former Smoker    Packs/day: 0.50    Years: 5.00    Pack years: 2.50    Types: Cigarettes    Quit date: 01/04/2002    Years since quitting: 17.4  . Smokeless tobacco: Never Used  Substance and Sexual Activity  . Alcohol use: Yes    Alcohol/week: 2.0 standard drinks    Types: 2 Cans of beer per week  . Drug use: No  . Sexual activity: Not on file  Other Topics Concern  . Not on file  Social History Narrative  . Not on file   Social Determinants of Health   Financial Resource Strain:   . Difficulty of Paying Living Expenses:   Food Insecurity:   . Worried About Charity fundraiser in the Last Year:   . Arboriculturist in the Last Year:   Transportation Needs:   . Film/video editor (Medical):   Marland Kitchen Lack of Transportation (Non-Medical):   Physical Activity:   . Days of Exercise per Week:   . Minutes of Exercise per Session:   Stress:   . Feeling of Stress :   Social Connections:   .  Frequency of Communication with Friends and Family:   . Frequency of Social Gatherings with Friends and Family:   . Attends Religious Services:   . Active Member of Clubs or Organizations:   . Attends Archivist Meetings:   Marland Kitchen Marital Status:   Intimate Partner Violence:   . Fear of Current or Ex-Partner:   . Emotionally Abused:   Marland Kitchen Physically Abused:   . Sexually Abused:     Family History  Problem Relation Age of Onset  . Allergic rhinitis Mother   . Allergic rhinitis Brother   . Asthma Brother   . Angioedema Neg Hx   . Eczema Neg Hx   . Immunodeficiency Neg Hx   . Urticaria Neg Hx   . Other Neg Hx        gynecomastia    BP 102/68   Ht 5\' 8"  (1.727 m)   Wt 160 lb (72.6 kg)   BMI 24.33 kg/m   Review of Systems: See HPI above.     Objective:  Physical Exam:  Gen: NAD, comfortable in exam room  Left knee: No gross  deformity, ecchymoses, effusion. No TTP. FROM with normal strength. Negative ant/post drawers. Negative valgus/varus testing. Negative lachmans.  Negative lever. Negative mcmurrays, apleys, thessalys, patellar apprehension. NV intact distally.   Assessment & Plan:  1. Left knee injury - patient's exam is reassuring though having a heaviness and some instability of the knee.  Reports feeling of swelling though no current effusion.  Will obtain x-rays with tunnel view to assess for OCD.  Icing, sleeve, aleve, avoid painful activities.  F/u in 6 weeks if x-rays normal for presumed knee contusion.

## 2019-06-13 NOTE — Patient Instructions (Signed)
Get x-rays of your knee after you leave today to assess for an osteochondral defect of your knee. We will call you with the results of these. Icing 15 minutes at a time 3-4 times a day. Sleeve for support. Aleve 2 tabs twice a day with food - I'd take this for 7 days then just as needed. Stop the meloxicam. Avoid deep squats, lunges, twisting, running for now. Follow up with me in 6 weeks for reevaluation assuming the x-rays are normal.

## 2019-06-18 ENCOUNTER — Encounter: Payer: Self-pay | Admitting: Plastic Surgery

## 2019-07-20 ENCOUNTER — Encounter: Payer: Self-pay | Admitting: Family Medicine

## 2019-07-20 ENCOUNTER — Other Ambulatory Visit: Payer: Self-pay

## 2019-07-20 ENCOUNTER — Ambulatory Visit (INDEPENDENT_AMBULATORY_CARE_PROVIDER_SITE_OTHER): Payer: 59 | Admitting: Family Medicine

## 2019-07-20 ENCOUNTER — Other Ambulatory Visit (HOSPITAL_COMMUNITY)
Admission: RE | Admit: 2019-07-20 | Discharge: 2019-07-20 | Disposition: A | Discharge status: Home / Self Care | Payer: 59 | Source: Ambulatory Visit | Attending: Family Medicine | Admitting: Family Medicine

## 2019-07-20 VITALS — BP 108/68 | HR 77 | Temp 98.3°F | Ht 67.0 in | Wt 162.5 lb

## 2019-07-20 DIAGNOSIS — N342 Other urethritis: Secondary | ICD-10-CM | POA: Insufficient documentation

## 2019-07-20 DIAGNOSIS — Z114 Encounter for screening for human immunodeficiency virus [HIV]: Secondary | ICD-10-CM | POA: Diagnosis not present

## 2019-07-20 DIAGNOSIS — R369 Urethral discharge, unspecified: Secondary | ICD-10-CM | POA: Diagnosis not present

## 2019-07-20 MED ORDER — CEFTRIAXONE SODIUM 500 MG IJ SOLR
500.0000 mg | Freq: Once | INTRAMUSCULAR | Status: AC
  Administered 2019-07-20: 500 mg via INTRAMUSCULAR

## 2019-07-20 MED ORDER — AZITHROMYCIN 250 MG PO TABS
500.0000 mg | ORAL_TABLET | Freq: Once | ORAL | Status: AC
  Administered 2019-07-20: 500 mg via ORAL

## 2019-07-20 NOTE — Addendum Note (Signed)
Addended by: Sharon Seller B on: 07/20/2019 10:49 AM   Modules accepted: Orders

## 2019-07-20 NOTE — Patient Instructions (Signed)
Give [place] 2-3 business days to get the results of your labs back.   Practice safe sex.  Let us know if you need anything.

## 2019-07-20 NOTE — Progress Notes (Signed)
Chief Complaint  Patient presents with  . Dysuria  . Penile Discharge    Travis Rose is a 49 y.o. male here for possible UTI.  Duration: 2 weeks. Symptoms: Dysuria, d/c Denies: urinary frequency, hematuria, urinary retention, fever, nausea and vomiting Hx of recurrent UTI? No +new sexual partner.  Past Medical History:  Diagnosis Date  . Allergy      BP 108/68 (BP Location: Left Arm, Patient Position: Sitting, Cuff Size: Normal)   Pulse 77   Temp 98.3 F (36.8 C) (Oral)   Ht 5\' 7"  (1.702 m)   Wt 162 lb 8 oz (73.7 kg)   SpO2 99%   BMI 25.45 kg/m  General: Awake, alert, appears stated age Heart: RRR Lungs: CTAB, normal respiratory effort, no accessory muscle usage Abd: BS+, soft, NT, ND, no masses or organomegaly MSK: No CVA tenderness, neg Lloyd's sign Psych: Age appropriate judgment and insight  Urethritis - Plan: Urine cytology ancillary only([place])  Penile discharge  Screening for HIV (human immunodeficiency virus) - Plan: HIV Antibody (routine testing w rflx)  Stay hydrated. Empirically tx for GC/chlamydia. Seek immediate care if pt starts to develop fevers, new/worsening symptoms, uncontrollable N/V. F/u prn. The patient voiced understanding and agreement to the plan.  [place], DO 07/20/19 10:35 AM

## 2019-07-21 LAB — HIV ANTIBODY (ROUTINE TESTING W REFLEX): HIV 1&2 Ab, 4th Generation: NONREACTIVE

## 2019-07-23 ENCOUNTER — Ambulatory Visit: Payer: 59 | Admitting: Family Medicine

## 2019-07-23 LAB — URINE CYTOLOGY ANCILLARY ONLY
Chlamydia: POSITIVE — AB
Comment: NEGATIVE
Comment: NEGATIVE
Comment: NORMAL
Neisseria Gonorrhea: NEGATIVE
Trichomonas: NEGATIVE

## 2019-07-25 ENCOUNTER — Ambulatory Visit (INDEPENDENT_AMBULATORY_CARE_PROVIDER_SITE_OTHER): Payer: 59 | Admitting: Family Medicine

## 2019-07-25 ENCOUNTER — Encounter: Payer: Self-pay | Admitting: Family Medicine

## 2019-07-25 ENCOUNTER — Other Ambulatory Visit: Payer: Self-pay

## 2019-07-25 VITALS — BP 102/68 | Ht 67.0 in | Wt 160.0 lb

## 2019-07-25 DIAGNOSIS — M25562 Pain in left knee: Secondary | ICD-10-CM

## 2019-07-25 DIAGNOSIS — M76899 Other specified enthesopathies of unspecified lower limb, excluding foot: Secondary | ICD-10-CM | POA: Insufficient documentation

## 2019-07-25 NOTE — Assessment & Plan Note (Signed)
Given x-rays were largely normal and had no findings that could account for his continued pain 6 weeks out from modified activity will lean more towards some type of possible cartilage damage or possible bone contusion causing his symptoms.  His Thessaly and McMurray's were negative however his symptoms are consistent with some type of possible medial meniscal injury and/or tear.  Given this patient was given multiple options and elected to proceed with MRI to get a more definitive answer as this will change how he approaches his continued rehab. - MRI ordered today - Continue to avoid activities that stress the knee such as deep squats, stair climbers, "wood chopping" - F/u after MRI

## 2019-07-25 NOTE — Progress Notes (Signed)
° ° °  SUBJECTIVE:   CHIEF COMPLAINT / HPI:   F/u for Left Knee Pain [name] is a 49 year old male who is presenting today for follow-up due to his left knee pain.  He was last evaluated in our clinic 6 weeks ago and states today he feels about a 25% improvement in his left knee but is still having significant limitations on what he can do.  He has not been running as advised but he did try a light jog yesterday and did have a lot of pain afterward.  He states he has not had any swelling skin changes or significant pain during typical workouts however with any kind of motion that requires him to use his left knee to stabilize himself he has significant pain.  The pain is described as a deep pain inside the knee and it makes his knee feel heavy.  The worst of the symptoms occur actually after the workout.  Doing home exercises regularly.  I did review his previous x-rays and discussed those results with him today.  His left knee x-ray 4 view was significant for some quadricep tendon calcification changes at the insertion of the superior pole the patella.  No suprapatellar effusion noted on x-ray.  PERTINENT  PMH / PSH: GAD  OBJECTIVE:   BP 102/68    Ht 5\' 7"  (1.702 m)    Wt 160 lb (72.6 kg)    BMI 25.06 kg/m   MSK Knee, : Inspection was negative for erythema, ecchymosis, and effusion. No obvious bony abnormalities or signs of osteophyte development. Palpation yielded no asymmetric warmth; No joint line tenderness; No condyle tenderness; No patellar tenderness; No patellar crepitus. Patellar and quadriceps tendons unremarkable, and no tenderness of the pes anserine bursa. No obvious Baker's cyst development. ROM normal in flexion (135 degrees) and extension (0 degrees). Normal hamstring and quadriceps strength. Neurovascularly intact bilaterally. Special Tests  - Cruciate/Collateral Ligaments:   - Anterior Drawer:  NEG - Posterior Drawer: NEG   - Lachman:  NEG   - Lever test: NEG   -  Varus/Valgus Stress test: NEG  - Meniscus:   - Thessaly: NEG (feels unstable but no pain)   - McMurray's: NEG   ASSESSMENT/PLAN:   Acute pain of left knee Given x-rays were largely normal and had no findings that could account for his continued pain 6 weeks out from modified activity will lean more towards some type of possible cartilage damage or possible bone contusion causing his symptoms.  His Thessaly and McMurray's were negative however his symptoms are consistent with some type of possible medial meniscal injury and/or tear.  Given this patient was given multiple options and elected to proceed with MRI to get a more definitive answer as this will change how he approaches his continued rehab.  Will assess for possible meniscus tear given feeling of instability with twisting. - MRI ordered today - Continue to avoid activities that stress the knee such as deep squats, stair climbers, "wood chopping" - F/u after MRI      Nuala Alpha, [place]

## 2019-07-25 NOTE — Patient Instructions (Signed)
It was great to meet you today! Thank you for letting me participate in your care!  Today, we discussed your continued left knee pain and given you are still having significant pain on certain movements and pain after workouts we will proceed with getting an MRI of your left knee to evaluate for a possible meniscal injury and/or a bone contusion.  Please follow up with [place] in our clinic after you have the MRI  Be well, Harolyn Rutherford, DO PGY-4, Sports Medicine Fellow [place]

## 2019-08-02 ENCOUNTER — Other Ambulatory Visit: Payer: Self-pay

## 2019-08-02 ENCOUNTER — Encounter: Payer: Self-pay | Admitting: Endocrinology

## 2019-08-02 DIAGNOSIS — N62 Hypertrophy of breast: Secondary | ICD-10-CM

## 2019-08-02 MED ORDER — TAMOXIFEN CITRATE 10 MG PO TABS: 10.0000 mg | ORAL_TABLET | Freq: Every day | ORAL | 3 refills | Status: DC

## 2019-08-08 ENCOUNTER — Ambulatory Visit
Admission: RE | Admit: 2019-08-08 | Discharge: 2019-08-08 | Disposition: A | Discharge status: Home / Self Care | Payer: 59 | Source: Ambulatory Visit | Attending: Family Medicine | Admitting: Family Medicine

## 2019-08-08 ENCOUNTER — Other Ambulatory Visit: Payer: Self-pay

## 2019-08-08 DIAGNOSIS — M25562 Pain in left knee: Secondary | ICD-10-CM

## 2019-08-18 ENCOUNTER — Other Ambulatory Visit: Payer: 59

## 2019-10-15 ENCOUNTER — Encounter: Payer: Self-pay | Admitting: Family Medicine

## 2019-10-15 ENCOUNTER — Other Ambulatory Visit: Payer: Self-pay

## 2019-10-15 ENCOUNTER — Ambulatory Visit (INDEPENDENT_AMBULATORY_CARE_PROVIDER_SITE_OTHER): Payer: 59 | Admitting: Family Medicine

## 2019-10-15 VITALS — BP 110/72 | HR 63 | Temp 97.6°F | Ht 68.0 in | Wt 167.0 lb

## 2019-10-15 DIAGNOSIS — L0292 Furuncle, unspecified: Secondary | ICD-10-CM

## 2019-10-15 MED ORDER — MUPIROCIN 2 % EX OINT: 1.0000 "application " | TOPICAL_OINTMENT | Freq: Two times a day (BID) | CUTANEOUS | 0 refills | Status: DC

## 2019-10-15 NOTE — Patient Instructions (Addendum)
Use mupirocin 2% twice daily for 7-10 days.   Reach out to Dr. Verlin Fester regarding a possible exemption.  Let us know if you need anything.

## 2019-10-15 NOTE — Progress Notes (Signed)
Chief Complaint  Patient presents with  . Injury    Travis Rose is a 49 y.o. male here for a skin complaint.  Duration: 5 days Location: perineum Pruritic? No Painful? No Drainage? No New soaps/lotions/topicals/detergents? No Sick contacts? No Other associated symptoms: He did cycle just before this started for the first time in a while. Had this happen many years ago.  Therapies tried thus far: Various baths/oils.   Past Medical History:  Diagnosis Date  . Allergy     BP 110/72 (BP Location: Left Arm, Patient Position: Sitting, Cuff Size: Normal)   Pulse 63   Temp 97.6 F (36.4 C) (Oral)   Ht 5\' 8"  (1.727 m)   Wt 167 lb (75.8 kg)   SpO2 99%   BMI 25.39 kg/m  Gen: awake, alert, appearing stated age Lungs: No accessory muscle use Skin: Pustule noted on R posterior perineal region. No drainage, erythema, TTP, fluctuance, excoriation Psych: Age appropriate judgment and insight  Furuncle - Plan: mupirocin ointment (BACTROBAN) 2 %  Orders as above. Warning signs and symptoms verbalized and written down in AVS.  He asked me about a vaccine exemption, which I declined today. Rec'd reaching out to his allergist given concern for rxn/allergy with it.  F/u prn. The patient voiced understanding and agreement to the plan.  [place], DO 10/15/19 11:53 AM

## 2019-10-23 ENCOUNTER — Ambulatory Visit (INDEPENDENT_AMBULATORY_CARE_PROVIDER_SITE_OTHER): Payer: 59 | Admitting: Family Medicine

## 2019-10-23 ENCOUNTER — Ambulatory Visit: Payer: Self-pay

## 2019-10-23 ENCOUNTER — Encounter: Payer: Self-pay | Admitting: Family Medicine

## 2019-10-23 ENCOUNTER — Other Ambulatory Visit: Payer: Self-pay

## 2019-10-23 VITALS — BP 122/80 | HR 61 | Ht 68.0 in | Wt 165.0 lb

## 2019-10-23 DIAGNOSIS — M76899 Other specified enthesopathies of unspecified lower limb, excluding foot: Secondary | ICD-10-CM | POA: Diagnosis not present

## 2019-10-23 DIAGNOSIS — M25562 Pain in left knee: Secondary | ICD-10-CM

## 2019-10-23 MED ORDER — NITROGLYCERIN 0.2 MG/HR TD PT24: MEDICATED_PATCH | TRANSDERMAL | 11 refills | Status: DC

## 2019-10-23 NOTE — Patient Instructions (Signed)
Good to see you Please try the nitro patches  Please avoid deep flexion of the knee   Please send me a message in MyChart with any questions or updates.  Please see me back in 4 weeks.   --Dr. Raeford Razor  Nitroglycerin Protocol   Apply 1/4 nitroglycerin patch to affected area daily.  Change position of patch within the affected area every 24 hours.  You may experience a headache during the first 1-2 weeks of using the patch, these should subside.  If you experience headaches after beginning nitroglycerin patch treatment, you may take your preferred over the counter pain reliever.  Another side effect of the nitroglycerin patch is skin irritation or rash related to patch adhesive.  Please notify our office if you develop more severe headaches or rash, and stop the patch.  Tendon healing with nitroglycerin patch may require 12 to 24 weeks depending on the extent of injury.  Men should not use if taking Viagra, Cialis, or Levitra.   Do not use if you have migraines or rosacea.

## 2019-10-23 NOTE — Progress Notes (Signed)
Travis Rose - 49 y.o. male MRN [id number]  Date of birth: [birth year]  SUBJECTIVE:  Including CC & ROS.  Chief Complaint  Patient presents with  . Knee Pain    left  . Foot Pain    right    Travis Rose is a 49 y.o. male that is presenting with acute on chronic left knee pain.  He initially felt the knee pain when he was doing high knees.  Since that time he is conservative therapy with mild improvement.  Has not had an injection.  Pain seems to be anterior nature..  Independent review of the MRI of the left knee shows mild irregularity of the lateral patellar facet.   Review of Systems See HPI   HISTORY: Past Medical, Surgical, Social, and Family History Reviewed & Updated per EMR.   Pertinent Historical Findings include:  Past Medical History:  Diagnosis Date  . Allergy     Past Surgical History:  Procedure Laterality Date  . NO PAST SURGERIES      Family History  Problem Relation Age of Onset  . Allergic rhinitis Mother   . Allergic rhinitis Brother   . Asthma Brother   . Angioedema Neg Hx   . Eczema Neg Hx   . Immunodeficiency Neg Hx   . Urticaria Neg Hx   . Other Neg Hx        gynecomastia    Social History   Socioeconomic History  . Marital status: Single    Spouse name: Not on file  . Number of children: Not on file  . Years of education: Not on file  . Highest education level: Not on file  Occupational History  . Not on file  Tobacco Use  . Smoking status: Former Smoker    Packs/day: 0.50    Years: 5.00    Pack years: 2.50    Types: Cigarettes    Quit date: 01/04/2002    Years since quitting: 17.8  . Smokeless tobacco: Never Used  Vaping Use  . Vaping Use: Never used  Substance and Sexual Activity  . Alcohol use: Yes    Alcohol/week: 2.0 standard drinks    Types: 2 Cans of beer per week  . Drug use: No  . Sexual activity: Not on file  Other Topics Concern  . Not on file  Social History Narrative  . Not on file   Social  Determinants of Health   Financial Resource Strain:   . Difficulty of Paying Living Expenses: Not on file  Food Insecurity:   . Worried About Charity fundraiser in the Last Year: Not on file  . Ran Out of Food in the Last Year: Not on file  Transportation Needs:   . Lack of Transportation (Medical): Not on file  . Lack of Transportation (Non-Medical): Not on file  Physical Activity:   . Days of Exercise per Week: Not on file  . Minutes of Exercise per Session: Not on file  Stress:   . Feeling of Stress : Not on file  Social Connections:   . Frequency of Communication with Friends and Family: Not on file  . Frequency of Social Gatherings with Friends and Family: Not on file  . Attends Religious Services: Not on file  . Active Member of Clubs or Organizations: Not on file  . Attends Archivist Meetings: Not on file  . Marital Status: Not on file  Intimate Partner Violence:   . Fear of Current or Ex-Partner: Not on  file  . Emotionally Abused: Not on file  . Physically Abused: Not on file  . Sexually Abused: Not on file     PHYSICAL EXAM:  VS: BP 122/80   Pulse 61   Ht 5\' 8"  (1.727 m)   Wt 165 lb (74.8 kg)   BMI 25.09 kg/m  Physical Exam Gen: NAD, alert, cooperative with exam, well-appearing MSK:  Left knee primary normal range of motion. Normal strength resistance. No instability. Good strength with hip abduction. Neurovascular intact  Limited ultrasound: Left knee:  No effusion suprapatellar pouch. There appears to be a hypoechoic change within the middle third of the quadricep tendon.  This appears to be a mucous change with no hyperemia at this point. Normal-appearing patellar tendon. Normal-appearing medial lateral meniscus.   Summary: Findings are suggestive of quadriceps tendinosis.  Ultrasound and interpretation by Clearance Coots, MD    ASSESSMENT & PLAN:   Quadriceps tendinitis Ultrasound shows an irregularity of the quadriceps tendon  when compared to contralateral side.  Otherwise structurally is a looks well. -Counseled on home exercise therapy and supportive care. -Nitro patches. -Would consider injection if no provement.

## 2019-10-23 NOTE — Assessment & Plan Note (Signed)
Ultrasound shows an irregularity of the quadriceps tendon when compared to contralateral side.  Otherwise structurally is a looks well. -Counseled on home exercise therapy and supportive care. -Nitro patches. -Would consider injection if no provement.

## 2019-10-25 ENCOUNTER — Other Ambulatory Visit: Payer: Self-pay | Admitting: Family Medicine

## 2019-10-25 MED ORDER — CEPHALEXIN 500 MG PO CAPS
500.0000 mg | ORAL_CAPSULE | Freq: Three times a day (TID) | ORAL | 0 refills | Status: DC
Start: 2019-10-25 — End: 2019-10-30

## 2019-10-29 ENCOUNTER — Encounter: Payer: Self-pay | Admitting: Family Medicine

## 2019-10-30 ENCOUNTER — Encounter: Payer: Self-pay | Admitting: Family Medicine

## 2019-10-30 ENCOUNTER — Ambulatory Visit (INDEPENDENT_AMBULATORY_CARE_PROVIDER_SITE_OTHER): Payer: 59 | Admitting: Family Medicine

## 2019-10-30 ENCOUNTER — Other Ambulatory Visit: Payer: Self-pay

## 2019-10-30 VITALS — BP 108/62 | HR 69 | Temp 97.6°F | Ht 68.0 in | Wt 165.0 lb

## 2019-10-30 DIAGNOSIS — S86811A Strain of other muscle(s) and tendon(s) at lower leg level, right leg, initial encounter: Secondary | ICD-10-CM

## 2019-10-30 DIAGNOSIS — L0292 Furuncle, unspecified: Secondary | ICD-10-CM | POA: Diagnosis not present

## 2019-10-30 MED ORDER — CYCLOBENZAPRINE HCL 10 MG PO TABS: 5.0000 mg | ORAL_TABLET | Freq: Three times a day (TID) | ORAL | 0 refills | Status: DC | PRN

## 2019-10-30 NOTE — Progress Notes (Signed)
CC: F/u skin complaint  Subjective: Patient is a 49 y.o. male here for f/u.  Tx'd for furuncle w topical mupriocin and later transitioned to Keflex. No improvement. Feels it is getting bigger. No drainage, fevers, redness.  He has been dealing with R foot pain. MT pads made things worse. He has been using various anti-inflammatories with little relief. His calf started hurting after using this. Did well with muscle relaxers in the past.    Past Medical History:  Diagnosis Date  . Allergy     Objective: BP 108/62 (BP Location: Left Arm, Patient Position: Sitting, Cuff Size: Normal)   Pulse 69   Temp 97.6 F (36.4 C) (Oral)   Ht 5\' 8"  (1.727 m)   Wt 165 lb (74.8 kg)   SpO2 100%   BMI 25.09 kg/m  General: Awake, appears stated age Skin: There is a pustule lying atop a circular area of induration measuring approx 1.5 cm in diameter. Mild ttp. No fluctuance or drainage. No erythema or excessive warmth. No excoriation.  Lungs: CTAB, no rales, wheezes or rhonchi. No accessory muscle use Psych: Age appropriate judgment and insight, normal affect and mood  Assessment and Plan: Furuncle  Strain of calf muscle, right, initial encounter - Plan: cyclobenzaprine (FLEXERIL) 10 MG tablet  1.  Finish the Keflex, could consider changed to doxycycline.  I wanted to take meloxicam daily.  Ice, consider warm compresses as well.  I do not think he needs incision and drainage. 2.  Continue with a sports medicine team.  I will prescribe Flexeril to see if it helps with his muscle tightness that developed after treatment for foot pain began. The patient voiced understanding and agreement to the plan.  [place], DO 10/30/19  11:58 AM

## 2019-10-30 NOTE — Patient Instructions (Addendum)
Use warm compresses over the area for 5-10 minutes. Alternate with ice or a cold pack for around 10 minutes. Try to do this 2-3 times per day.  Use the meloxicam daily.  OK to take Tylenol 1000 mg (2 extra strength tabs) or 975 mg (3 regular strength tabs) every 6 hours as needed.  Finish the Keflex and let me know if we aren't doing better.  Take Flexeril (cyclobenzaprine) 1-2 hours before planned bedtime. If it makes you drowsy, do not take during the day. You can try half a tab the following night.   Let us know if you need anything.

## 2019-10-31 ENCOUNTER — Other Ambulatory Visit: Payer: Self-pay | Admitting: Family Medicine

## 2019-10-31 DIAGNOSIS — S86811A Strain of other muscle(s) and tendon(s) at lower leg level, right leg, initial encounter: Secondary | ICD-10-CM

## 2019-10-31 MED ORDER — CYCLOBENZAPRINE HCL 10 MG PO TABS: 5.0000 mg | ORAL_TABLET | Freq: Three times a day (TID) | ORAL | 0 refills | Status: DC | PRN

## 2019-11-12 ENCOUNTER — Other Ambulatory Visit: Payer: Self-pay | Admitting: Family Medicine

## 2019-11-12 MED ORDER — PREDNISONE 5 MG PO TABS: ORAL_TABLET | ORAL | 0 refills | Status: DC

## 2019-12-06 ENCOUNTER — Encounter: Payer: Self-pay | Admitting: Family Medicine

## 2019-12-06 ENCOUNTER — Other Ambulatory Visit: Payer: Self-pay

## 2019-12-06 ENCOUNTER — Ambulatory Visit: Payer: Self-pay

## 2019-12-06 ENCOUNTER — Ambulatory Visit (INDEPENDENT_AMBULATORY_CARE_PROVIDER_SITE_OTHER): Payer: 59 | Admitting: Family Medicine

## 2019-12-06 VITALS — BP 118/73 | HR 61 | Ht 68.0 in | Wt 163.0 lb

## 2019-12-06 DIAGNOSIS — M222X2 Patellofemoral disorders, left knee: Secondary | ICD-10-CM

## 2019-12-06 MED ORDER — PENNSAID 2 % EX SOLN: 1.0000 "application " | Freq: Two times a day (BID) | CUTANEOUS | 2 refills | Status: DC

## 2019-12-06 MED ORDER — TRIAMCINOLONE ACETONIDE 40 MG/ML IJ SUSP
40.0000 mg | Freq: Once | INTRAMUSCULAR | Status: AC
Start: 2019-12-06 — End: 2019-12-06
  Administered 2019-12-06: 40 mg via INTRA_ARTICULAR

## 2019-12-06 NOTE — Progress Notes (Signed)
Medication Samples have been provided to the patient.  Drug name: Pennsaid      Strength: 2%       Qty: 2 boxes  LOT: [id number]  Exp.Date: 05/2020  Dosing instructions: Use a pea size amount and rub gently.  The patient has been instructed regarding the correct time, dose, and frequency of taking this medication, including desired effects and most common side effects.   Sherrie George, [place] 10:28 AM 12/06/2019

## 2019-12-06 NOTE — Progress Notes (Signed)
Travis Rose - 49 y.o. male MRN [id number]  Date of birth: 1970-06-07  SUBJECTIVE:  Including CC & ROS.  Chief Complaint  Patient presents with  . Knee Pain    left    Travis Rose is a 49 y.o. male that is presenting with worsening of his left knee pain.  He has tried nitro patches and physical therapy.  He has tried medications with limited improvement.  The pain is still ongoing and affects his daily life.  Unable to go up and down stairs without significant pain.   Review of Systems See HPI   HISTORY: Past Medical, Surgical, Social, and Family History Reviewed & Updated per EMR.   Pertinent Historical Findings include:  Past Medical History:  Diagnosis Date  . Allergy     Past Surgical History:  Procedure Laterality Date  . NO PAST SURGERIES      Family History  Problem Relation Age of Onset  . Allergic rhinitis Mother   . Allergic rhinitis Brother   . Asthma Brother   . Angioedema Neg Hx   . Eczema Neg Hx   . Immunodeficiency Neg Hx   . Urticaria Neg Hx   . Other Neg Hx        gynecomastia    Social History   Socioeconomic History  . Marital status: Single    Spouse name: Not on file  . Number of children: Not on file  . Years of education: Not on file  . Highest education level: Not on file  Occupational History  . Not on file  Tobacco Use  . Smoking status: Former Smoker    Packs/day: 0.50    Years: 5.00    Pack years: 2.50    Types: Cigarettes    Quit date: 01/04/2002    Years since quitting: 17.9  . Smokeless tobacco: Never Used  Vaping Use  . Vaping Use: Never used  Substance and Sexual Activity  . Alcohol use: Yes    Alcohol/week: 2.0 standard drinks    Types: 2 Cans of beer per week  . Drug use: No  . Sexual activity: Not on file  Other Topics Concern  . Not on file  Social History Narrative  . Not on file   Social Determinants of Health   Financial Resource Strain:   . Difficulty of Paying Living Expenses: Not on file   Food Insecurity:   . Worried About Charity fundraiser in the Last Year: Not on file  . Ran Out of Food in the Last Year: Not on file  Transportation Needs:   . Lack of Transportation (Medical): Not on file  . Lack of Transportation (Non-Medical): Not on file  Physical Activity:   . Days of Exercise per Week: Not on file  . Minutes of Exercise per Session: Not on file  Stress:   . Feeling of Stress : Not on file  Social Connections:   . Frequency of Communication with Friends and Family: Not on file  . Frequency of Social Gatherings with Friends and Family: Not on file  . Attends Religious Services: Not on file  . Active Member of Clubs or Organizations: Not on file  . Attends Archivist Meetings: Not on file  . Marital Status: Not on file  Intimate Partner Violence:   . Fear of Current or Ex-Partner: Not on file  . Emotionally Abused: Not on file  . Physically Abused: Not on file  . Sexually Abused: Not on file  PHYSICAL EXAM:  VS: BP 118/73   Pulse 61   Ht 5\' 8"  (1.727 m)   Wt 163 lb (73.9 kg)   BMI 24.78 kg/m  Physical Exam Gen: NAD, alert, cooperative with exam, well-appearing Left knee: No obvious effusion. Normal range of motion. Normal strength resistance. Neurovascularly intact   Aspiration/Injection Procedure Note Travis Rose [birth year]  Procedure: Injection Indications: Left knee pain  Procedure Details Consent: Risks of procedure as well as the alternatives and risks of each were explained to the (patient/caregiver).  Consent for procedure obtained. Time Out: Verified patient identification, verified procedure, site/side was marked, verified correct patient position, special equipment/implants available, medications/allergies/relevent history reviewed, required imaging and test results available.  Performed.  The area was cleaned with iodine and alcohol swabs.    The left knee superior lateral suprapatellar pouch was injected using 5  cc of 1% lidocaine on a 1-1/2 inch needle.  The syringe was switched and a mixture containing 1 cc's of 40 mg Kenalog and 4 cc's of 0.25% bupivacaine was injected.  Ultrasound was used. Images were obtained in ong views showing the injection.     A sterile dressing was applied.  Patient did tolerate procedure well.    ASSESSMENT & PLAN:   Patellofemoral pain syndrome of left knee We have treated his left knee with nitro patches for tendinitis but his symptoms seem to be ongoing.  He did have mild degenerative change within the knee which could represent why his pain is ongoing.  -Counseled on home exercise therapy and supportive care. -Pennsaid sample and sent in prescription. -Could consider PRP injection.

## 2019-12-06 NOTE — Patient Instructions (Signed)
Good to see you Please try the rub on medicine Please use ice as needed    Please send me a message in MyChart with any questions or updates.  Please see me back in 4 weeks.   --Dr. Raeford Razor

## 2019-12-06 NOTE — Assessment & Plan Note (Signed)
We have treated his left knee with nitro patches for tendinitis but his symptoms seem to be ongoing.  He did have mild degenerative change within the knee which could represent why his pain is ongoing.  -Counseled on home exercise therapy and supportive care. -Pennsaid sample and sent in prescription. -Could consider PRP injection.

## 2019-12-11 ENCOUNTER — Encounter: Payer: Self-pay | Admitting: Family Medicine

## 2020-01-03 ENCOUNTER — Ambulatory Visit (INDEPENDENT_AMBULATORY_CARE_PROVIDER_SITE_OTHER): Payer: 59 | Admitting: Family Medicine

## 2020-01-03 ENCOUNTER — Other Ambulatory Visit: Payer: Self-pay

## 2020-01-03 DIAGNOSIS — M222X2 Patellofemoral disorders, left knee: Secondary | ICD-10-CM

## 2020-01-03 NOTE — Assessment & Plan Note (Signed)
Pain is been ongoing.  Has gotten some improvement with the Pennsaid and the nitro patches.  Also feels pain inside of the knee. -Counseled on home exercise therapy and supportive care. -We will pursue PRP injection. -Could consider shockwave therapy of the quad tendon.

## 2020-01-03 NOTE — Progress Notes (Signed)
  Travis Rose - 49 y.o. male MRN [id number]  Date of birth: August 22, 1970  SUBJECTIVE:  Including CC & ROS.  No chief complaint on file.   Travis Rose is a 49 y.o. male that is has ongoing left knee pain.  The pain seems to be worse after squats or doing rotations with a medicine ball.  Still occurring proximally but also globally after workouts..   Review of Systems See HPI   HISTORY: Past Medical, Surgical, Social, and Family History Reviewed & Updated per EMR.   Pertinent Historical Findings include:  Past Medical History:  Diagnosis Date  . Allergy     Past Surgical History:  Procedure Laterality Date  . NO PAST SURGERIES      Family History  Problem Relation Age of Onset  . Allergic rhinitis Mother   . Allergic rhinitis Brother   . Asthma Brother   . Angioedema Neg Hx   . Eczema Neg Hx   . Immunodeficiency Neg Hx   . Urticaria Neg Hx   . Other Neg Hx        gynecomastia    Social History   Socioeconomic History  . Marital status: Single    Spouse name: Not on file  . Number of children: Not on file  . Years of education: Not on file  . Highest education level: Not on file  Occupational History  . Not on file  Tobacco Use  . Smoking status: Former Smoker    Packs/day: 0.50    Years: 5.00    Pack years: 2.50    Types: Cigarettes    Quit date: 01/04/2002    Years since quitting: 18.0  . Smokeless tobacco: Never Used  Vaping Use  . Vaping Use: Never used  Substance and Sexual Activity  . Alcohol use: Yes    Alcohol/week: 2.0 standard drinks    Types: 2 Cans of beer per week  . Drug use: No  . Sexual activity: Not on file  Other Topics Concern  . Not on file  Social History Narrative  . Not on file   Social Determinants of Health   Financial Resource Strain: Not on file  Food Insecurity: Not on file  Transportation Needs: Not on file  Physical Activity: Not on file  Stress: Not on file  Social Connections: Not on file  Intimate Partner  Violence: Not on file     PHYSICAL EXAM:  VS: BP 92/62   Ht 5\' 8"  (1.727 m)   Wt 163 lb (73.9 kg)   BMI 24.78 kg/m  Physical Exam Gen: NAD, alert, cooperative with exam, well-appearing MSK:  Left knee: No effusion. Normal strength resistance. Neurovascular intact     ASSESSMENT & PLAN:   Patellofemoral pain syndrome of left knee Pain is been ongoing.  Has gotten some improvement with the Pennsaid and the nitro patches.  Also feels pain inside of the knee. -Counseled on home exercise therapy and supportive care. -We will pursue PRP injection. -Could consider shockwave therapy of the quad tendon.

## 2020-01-18 ENCOUNTER — Encounter: Payer: Self-pay | Admitting: Family Medicine

## 2020-01-30 ENCOUNTER — Ambulatory Visit: Payer: Self-pay

## 2020-01-30 ENCOUNTER — Ambulatory Visit (INDEPENDENT_AMBULATORY_CARE_PROVIDER_SITE_OTHER): Payer: 59 | Admitting: Family Medicine

## 2020-01-30 ENCOUNTER — Other Ambulatory Visit: Payer: Self-pay

## 2020-01-30 VITALS — BP 106/70 | Ht 68.0 in | Wt 165.0 lb

## 2020-01-30 DIAGNOSIS — M238X2 Other internal derangements of left knee: Secondary | ICD-10-CM | POA: Insufficient documentation

## 2020-01-30 NOTE — Assessment & Plan Note (Signed)
Acute on chronic in nature.  We have tried intra-articular injection and other conservative measures with improvement. -Try PRP today. -Counseled on post injection protocol

## 2020-01-30 NOTE — Progress Notes (Signed)
Travis Rose - 50 y.o. male MRN [id number]  Date of birth: 10/16/1970  SUBJECTIVE:  Including CC & ROS.  No chief complaint on file.   Travis Rose is a 50 y.o. male that is presenting with ongoing left knee pain.  He is here for a PRP injection.   Review of Systems See HPI   HISTORY: Past Medical, Surgical, Social, and Family History Reviewed & Updated per EMR.   Pertinent Historical Findings include:  Past Medical History:  Diagnosis Date  . Allergy     Past Surgical History:  Procedure Laterality Date  . NO PAST SURGERIES      Family History  Problem Relation Age of Onset  . Allergic rhinitis Mother   . Allergic rhinitis Brother   . Asthma Brother   . Angioedema Neg Hx   . Eczema Neg Hx   . Immunodeficiency Neg Hx   . Urticaria Neg Hx   . Other Neg Hx        gynecomastia    Social History   Socioeconomic History  . Marital status: Single    Spouse name: Not on file  . Number of children: Not on file  . Years of education: Not on file  . Highest education level: Not on file  Occupational History  . Not on file  Tobacco Use  . Smoking status: Former Smoker    Packs/day: 0.50    Years: 5.00    Pack years: 2.50    Types: Cigarettes    Quit date: 01/04/2002    Years since quitting: 18.0  . Smokeless tobacco: Never Used  Vaping Use  . Vaping Use: Never used  Substance and Sexual Activity  . Alcohol use: Yes    Alcohol/week: 2.0 standard drinks    Types: 2 Cans of beer per week  . Drug use: No  . Sexual activity: Not on file  Other Topics Concern  . Not on file  Social History Narrative  . Not on file   Social Determinants of Health   Financial Resource Strain: Not on file  Food Insecurity: Not on file  Transportation Needs: Not on file  Physical Activity: Not on file  Stress: Not on file  Social Connections: Not on file  Intimate Partner Violence: Not on file     PHYSICAL EXAM:  VS: BP 106/70   Ht 5\' 8"  (1.727 m)   Wt 165 lb (74.8  kg)   BMI 25.09 kg/m  Physical Exam Gen: NAD, alert, cooperative with exam, well-appearing   Aspiration/Injection Procedure Note Travis Rose 30-Jan-1970  Procedure: Injection Indications: Left knee pain  Procedure Details Consent: Risks of procedure as well as the alternatives and risks of each were explained to the (patient/caregiver).  Consent for procedure obtained. Time Out: Verified patient identification, verified procedure, site/side was marked, verified correct patient position, special equipment/implants available, medications/allergies/relevent history reviewed, required imaging and test results available.  Performed.  The area was cleaned with iodine and alcohol swabs.    The left knee superior lateral suprapatellar pouch was injected using 3 cc's of 1% lidocaine with a 22 1 1/2" needle.  The syringe was switched and 6 cc of PRP was injected.  Ultrasound was used. Images were obtained in long views showing the injection.     A sterile dressing was applied.  Patient did tolerate procedure well.   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  We have tried intra-articular injection and other conservative  measures with improvement. -Try PRP today. -Counseled on post injection protocol

## 2020-01-30 NOTE — Patient Instructions (Signed)
Good to see you  Please use the crutches to be partial weight bearing for 1 week.  The crutches can be removed for the 2nd week with only light walking.  The 3rd and 4th week can include light exercise and light cardio The 5th and 7 th week include moderate exercise and nothing high velocity or impact.  The 8th week and on can return to normal exercise   Please send me a message in MyChart with any questions or updates.  Please see me back in 6 weeks .   --Dr. Raeford Razor  Acetaminophen can be used for mild pain.   Some brief (10 minutes or less) period of heat or ice therapy will not hurt the therapy, but it is not required.  Improvements in pain and function should be expected from 8 weeks to 12 weeks after injection.

## 2020-02-08 ENCOUNTER — Encounter: Payer: Self-pay | Admitting: Family Medicine

## 2020-02-29 ENCOUNTER — Other Ambulatory Visit: Payer: Self-pay

## 2020-02-29 ENCOUNTER — Telehealth (INDEPENDENT_AMBULATORY_CARE_PROVIDER_SITE_OTHER): Payer: 59 | Admitting: Family Medicine

## 2020-02-29 DIAGNOSIS — M238X2 Other internal derangements of left knee: Secondary | ICD-10-CM

## 2020-02-29 NOTE — Assessment & Plan Note (Signed)
He started doing light exercise and felt the same form of pain that he had in the beginning.  Therapy with limited improvement thus far. -Counseled on home exercise therapy and supportive care. -Pain may be more associated with the quad tendon and can try shockwave therapy. -Discussed the potential of referral for surgery if symptoms just fail to improve with measures thus far.

## 2020-02-29 NOTE — Progress Notes (Signed)
Virtual Visit via Video Note  I connected with Travis Rose on 02/29/20 at  8:30 AM EST by a video enabled telemedicine application and verified that I am speaking with the correct person using two identifiers.  Location: Patient: home Provider: office   I discussed the limitations of evaluation and management by telemedicine and the availability of in person appointments. The patient expressed understanding and agreed to proceed.  History of Present Illness:  [name] is a 50 year old male that is following up after the PRP injection of his left knee.  He still has pain with riding a stationary bike.  Has not taken any anti-inflammatories.  He followed the post injection protocol   Observations/Objective:  Gen: NAD, alert, cooperative with exam, well-appearing  Assessment and Plan:  Chondral defect of left patella He started doing light exercise and felt the same form of pain that he had in the beginning.  Therapy with limited improvement thus far. -Counseled on home exercise therapy and supportive care. -Pain may be more associated with the quad tendon and can try shockwave therapy. -Discussed the potential of referral for surgery if symptoms just fail to improve with measures thus far.  Follow Up Instructions:    I discussed the assessment and treatment plan with the patient. The patient was provided an opportunity to ask questions and all were answered. The patient agreed with the plan and demonstrated an understanding of the instructions.   The patient was advised to call back or seek an in-person evaluation if the symptoms worsen or if the condition fails to improve as anticipated.    Clearance Coots, MD

## 2020-03-04 ENCOUNTER — Ambulatory Visit: Payer: Self-pay | Admitting: Family Medicine

## 2020-03-04 ENCOUNTER — Other Ambulatory Visit: Payer: Self-pay

## 2020-03-04 DIAGNOSIS — M76899 Other specified enthesopathies of unspecified lower limb, excluding foot: Secondary | ICD-10-CM

## 2020-03-04 NOTE — Progress Notes (Signed)
  Travis Rose - 50 y.o. male MRN [id number]  Date of birth: November 21, 1970  SUBJECTIVE:  Including CC & ROS.  No chief complaint on file.   Travis Rose is a 50 y.o. male that is presenting with left ongoing knee pain.  He is here for his first shockwave therapy.  Review of Systems See HPI   HISTORY: Past Medical, Surgical, Social, and Family History Reviewed & Updated per EMR.   Pertinent Historical Findings include:  Past Medical History:  Diagnosis Date  . Allergy     Past Surgical History:  Procedure Laterality Date  . NO PAST SURGERIES      Family History  Problem Relation Age of Onset  . Allergic rhinitis Mother   . Allergic rhinitis Brother   . Asthma Brother   . Angioedema Neg Hx   . Eczema Neg Hx   . Immunodeficiency Neg Hx   . Urticaria Neg Hx   . Other Neg Hx        gynecomastia    Social History   Socioeconomic History  . Marital status: Single    Spouse name: Not on file  . Number of children: Not on file  . Years of education: Not on file  . Highest education level: Not on file  Occupational History  . Not on file  Tobacco Use  . Smoking status: Former Smoker    Packs/day: 0.50    Years: 5.00    Pack years: 2.50    Types: Cigarettes    Quit date: 01/04/2002    Years since quitting: 18.1  . Smokeless tobacco: Never Used  Vaping Use  . Vaping Use: Never used  Substance and Sexual Activity  . Alcohol use: Yes    Alcohol/week: 2.0 standard drinks    Types: 2 Cans of beer per week  . Drug use: No  . Sexual activity: Not on file  Other Topics Concern  . Not on file  Social History Narrative  . Not on file   Social Determinants of Health   Financial Resource Strain: Not on file  Food Insecurity: Not on file  Transportation Needs: Not on file  Physical Activity: Not on file  Stress: Not on file  Social Connections: Not on file  Intimate Partner Violence: Not on file     PHYSICAL EXAM:  VS: Ht 5\' 8"  (1.727 m)   Wt 168 lb (76.2  kg)   BMI 25.54 kg/m  Physical Exam Gen: NAD, alert, cooperative with exam, well-appearing  Aspiration/Injection Procedure Note Berthel Bagnall 1970-11-09  Procedure: ECSWT Indications: left quadricep tnedon pain   Procedure Details Consent: Risks of procedure as well as the alternatives and risks of each were explained to the (patient/caregiver).  Consent for procedure obtained. Time Out: Verified patient identification, verified procedure, site/side was marked, verified correct patient position, special equipment/implants available, medications/allergies/relevent history reviewed, required imaging and test results available.  Performed.  The area was cleaned with iodine and alcohol swabs.    The left quadricep tendon was targeted for Extracorporeal shockwave therapy.   Preset: Patellar tendinitis Power Level: 60 Frequency: 10 Impulse/cycles: 2000 Head size: large    Patient did tolerate procedure well.   ASSESSMENT & PLAN:   Quadriceps tendinitis Here for 1st ECSWT treatment.  - f/u in one week  - counseled on post treatment care

## 2020-03-04 NOTE — Assessment & Plan Note (Signed)
Here for 1st ECSWT treatment.  - f/u in one week  - counseled on post treatment care

## 2020-03-10 ENCOUNTER — Other Ambulatory Visit: Payer: Self-pay

## 2020-03-10 ENCOUNTER — Ambulatory Visit (INDEPENDENT_AMBULATORY_CARE_PROVIDER_SITE_OTHER): Payer: Self-pay | Admitting: Family Medicine

## 2020-03-10 DIAGNOSIS — M76899 Other specified enthesopathies of unspecified lower limb, excluding foot: Secondary | ICD-10-CM

## 2020-03-10 NOTE — Progress Notes (Signed)
  Travis Rose - 50 y.o. male MRN [id number]  Date of birth: [birth year]  SUBJECTIVE:  Including CC & ROS.  No chief complaint on file.   Travis Rose is a 50 y.o. male that is following up for second ECSWT treatment.    Review of Systems See HPI   HISTORY: Past Medical, Surgical, Social, and Family History Reviewed & Updated per EMR.   Pertinent Historical Findings include:  Past Medical History:  Diagnosis Date  . Allergy     Past Surgical History:  Procedure Laterality Date  . NO PAST SURGERIES      Family History  Problem Relation Age of Onset  . Allergic rhinitis Mother   . Allergic rhinitis Brother   . Asthma Brother   . Angioedema Neg Hx   . Eczema Neg Hx   . Immunodeficiency Neg Hx   . Urticaria Neg Hx   . Other Neg Hx        gynecomastia    Social History   Socioeconomic History  . Marital status: Single    Spouse name: Not on file  . Number of children: Not on file  . Years of education: Not on file  . Highest education level: Not on file  Occupational History  . Not on file  Tobacco Use  . Smoking status: Former Smoker    Packs/day: 0.50    Years: 5.00    Pack years: 2.50    Types: Cigarettes    Quit date: 01/04/2002    Years since quitting: 18.1  . Smokeless tobacco: Never Used  Vaping Use  . Vaping Use: Never used  Substance and Sexual Activity  . Alcohol use: Yes    Alcohol/week: 2.0 standard drinks    Types: 2 Cans of beer per week  . Drug use: No  . Sexual activity: Not on file  Other Topics Concern  . Not on file  Social History Narrative  . Not on file   Social Determinants of Health   Financial Resource Strain: Not on file  Food Insecurity: Not on file  Transportation Needs: Not on file  Physical Activity: Not on file  Stress: Not on file  Social Connections: Not on file  Intimate Partner Violence: Not on file     PHYSICAL EXAM:  VS: Ht 5\' 8"  (1.727 m)   Wt 168 lb (76.2 kg)   BMI 25.54 kg/m  Physical  Exam Gen: NAD, alert, cooperative with exam, well-appearing  Aspiration/Injection Procedure Note Travis Rose [birth year]  Procedure: ECSWT Indications: left knee pain   Procedure Details Consent: Risks of procedure as well as the alternatives and risks of each were explained to the (patient/caregiver).  Consent for procedure obtained. Time Out: Verified patient identification, verified procedure, site/side was marked, verified correct patient position, special equipment/implants available, medications/allergies/relevent history reviewed, required imaging and test results available.  Performed.  The area was cleaned with iodine and alcohol swabs.    The left quad tendon was targeted for Extracorporeal shockwave therapy.   Preset: patellar tendinopathy  Power Level: 60 for 1000 cycles, 80 for 1000 cycles  Frequency: 10 Impulse/cycles: 2000 Head size: large    Patient did tolerate procedure well.     ASSESSMENT & PLAN:   Quadriceps tendinitis Completed his second ECSWT  - f/u in one week.

## 2020-03-10 NOTE — Assessment & Plan Note (Signed)
Completed his second ECSWT  - f/u in one week.

## 2020-03-17 ENCOUNTER — Ambulatory Visit (INDEPENDENT_AMBULATORY_CARE_PROVIDER_SITE_OTHER): Payer: 59 | Admitting: Family Medicine

## 2020-03-17 ENCOUNTER — Ambulatory Visit: Payer: 59

## 2020-03-17 ENCOUNTER — Ambulatory Visit: Payer: Self-pay | Admitting: Family Medicine

## 2020-03-17 ENCOUNTER — Other Ambulatory Visit: Payer: Self-pay

## 2020-03-17 VITALS — Ht 68.0 in | Wt 168.0 lb

## 2020-03-17 DIAGNOSIS — G5761 Lesion of plantar nerve, right lower limb: Secondary | ICD-10-CM | POA: Diagnosis not present

## 2020-03-17 DIAGNOSIS — M76899 Other specified enthesopathies of unspecified lower limb, excluding foot: Secondary | ICD-10-CM

## 2020-03-17 MED ORDER — TRIAMCINOLONE ACETONIDE 40 MG/ML IJ SUSP
40.0000 mg | Freq: Once | INTRAMUSCULAR | Status: AC
Start: 2020-03-17 — End: 2020-03-17
  Administered 2020-03-17: 40 mg via INTRA_ARTICULAR

## 2020-03-17 NOTE — Assessment & Plan Note (Signed)
Completed third ECSWT  - f/u in one week.

## 2020-03-17 NOTE — Assessment & Plan Note (Signed)
Findings seem most consistent with a Morton's neuroma.  The more compressible she wears the more pain he has. -Counseled on home exercise therapy and supportive care. -We have provided him with metatarsal pads. -Injection. -Could consider further imaging.

## 2020-03-17 NOTE — Progress Notes (Signed)
Travis Rose - 50 y.o. male MRN [id number]  Date of birth: August 08, 1970  SUBJECTIVE:  Including CC & ROS.  No chief complaint on file.   Travis Rose is a 50 y.o. male that is presenting with ongoing right foot pain.  He initially felt the pain after he was playing the drums barefoot.  He feels the pain when he wears a shoe that is not as cushioned as a normal tennis shoe.  He had the pain exacerbated when he went on a hike with a skiing boots.  He feels it on the plantar aspect intermittently.   Review of Systems See HPI   HISTORY: Past Medical, Surgical, Social, and Family History Reviewed & Updated per EMR.   Pertinent Historical Findings include:  Past Medical History:  Diagnosis Date  . Allergy     Past Surgical History:  Procedure Laterality Date  . NO PAST SURGERIES      Family History  Problem Relation Age of Onset  . Allergic rhinitis Mother   . Allergic rhinitis Brother   . Asthma Brother   . Angioedema Neg Hx   . Eczema Neg Hx   . Immunodeficiency Neg Hx   . Urticaria Neg Hx   . Other Neg Hx        gynecomastia    Social History   Socioeconomic History  . Marital status: Single    Spouse name: Not on file  . Number of children: Not on file  . Years of education: Not on file  . Highest education level: Not on file  Occupational History  . Not on file  Tobacco Use  . Smoking status: Former Smoker    Packs/day: 0.50    Years: 5.00    Pack years: 2.50    Types: Cigarettes    Quit date: 01/04/2002    Years since quitting: 18.2  . Smokeless tobacco: Never Used  Vaping Use  . Vaping Use: Never used  Substance and Sexual Activity  . Alcohol use: Yes    Alcohol/week: 2.0 standard drinks    Types: 2 Cans of beer per week  . Drug use: No  . Sexual activity: Not on file  Other Topics Concern  . Not on file  Social History Narrative  . Not on file   Social Determinants of Health   Financial Resource Strain: Not on file  Food Insecurity: Not on  file  Transportation Needs: Not on file  Physical Activity: Not on file  Stress: Not on file  Social Connections: Not on file  Intimate Partner Violence: Not on file     PHYSICAL EXAM:  VS: Ht 5\' 8"  (1.727 m)   Wt 168 lb (76.2 kg)   BMI 25.54 kg/m  Physical Exam Gen: NAD, alert, cooperative with exam, well-appearing   Limited ultrasound: Right foot:  There appears to be a heterotrophic hypoechoic change between the second and third metatarsal.  The nerve in this area appears to be enlarged  Summary: Findings would suggest a Morton's neuroma.  Ultrasound and interpretation by Clearance Coots, MD   Aspiration/Injection Procedure Note Travis Rose 08-06-1970  Procedure: Injection Indications: right foot pain   Procedure Details Consent: Risks of procedure as well as the alternatives and risks of each were explained to the (patient/caregiver).  Consent for procedure obtained. Time Out: Verified patient identification, verified procedure, site/side was marked, verified correct patient position, special equipment/implants available, medications/allergies/relevent history reviewed, required imaging and test results available.  Performed.  The area was cleaned with  iodine and alcohol swabs.    The right foot was injected using 1 cc's of 40 mg Kenalog and 4 cc's of 0.25% bupivacaine with a 25 1" needle.  Ultrasound was used. Images were obtained in short views showing the injection.     A sterile dressing was applied.  Patient did tolerate procedure well.      ASSESSMENT & PLAN:   Morton's neuroma of right foot Findings seem most consistent with a Morton's neuroma.  The more compressible she wears the more pain he has. -Counseled on home exercise therapy and supportive care. -We have provided him with metatarsal pads. -Injection. -Could consider further imaging.

## 2020-03-17 NOTE — Progress Notes (Signed)
  Travis Rose - 50 y.o. male MRN [id number]  Date of birth: 09-16-1970  SUBJECTIVE:  Including CC & ROS.  No chief complaint on file.   Travis Rose is a 50 y.o. male that is presenting for third shockwave therapy.    Review of Systems See HPI   HISTORY: Past Medical, Surgical, Social, and Family History Reviewed & Updated per EMR.   Pertinent Historical Findings include:  Past Medical History:  Diagnosis Date  . Allergy     Past Surgical History:  Procedure Laterality Date  . NO PAST SURGERIES      Family History  Problem Relation Age of Onset  . Allergic rhinitis Mother   . Allergic rhinitis Brother   . Asthma Brother   . Angioedema Neg Hx   . Eczema Neg Hx   . Immunodeficiency Neg Hx   . Urticaria Neg Hx   . Other Neg Hx        gynecomastia    Social History   Socioeconomic History  . Marital status: Single    Spouse name: Not on file  . Number of children: Not on file  . Years of education: Not on file  . Highest education level: Not on file  Occupational History  . Not on file  Tobacco Use  . Smoking status: Former Smoker    Packs/day: 0.50    Years: 5.00    Pack years: 2.50    Types: Cigarettes    Quit date: 01/04/2002    Years since quitting: 18.2  . Smokeless tobacco: Never Used  Vaping Use  . Vaping Use: Never used  Substance and Sexual Activity  . Alcohol use: Yes    Alcohol/week: 2.0 standard drinks    Types: 2 Cans of beer per week  . Drug use: No  . Sexual activity: Not on file  Other Topics Concern  . Not on file  Social History Narrative  . Not on file   Social Determinants of Health   Financial Resource Strain: Not on file  Food Insecurity: Not on file  Transportation Needs: Not on file  Physical Activity: Not on file  Stress: Not on file  Social Connections: Not on file  Intimate Partner Violence: Not on file     PHYSICAL EXAM:  VS: Ht 5\' 8"  (1.727 m)   Wt 168 lb (76.2 kg)   BMI 25.54 kg/m  Physical Exam Gen:  NAD, alert, cooperative with exam, well-appearing   Aspiration/Injection Procedure Note Travis Rose 1970-03-05  Procedure: ECSWT Indications: left quad tendon pain   Procedure Details Consent: Risks of procedure as well as the alternatives and risks of each were explained to the (patient/caregiver).  Consent for procedure obtained. Time Out: Verified patient identification, verified procedure, site/side was marked, verified correct patient position, special equipment/implants available, medications/allergies/relevent history reviewed, required imaging and test results available.  Performed.  The area was cleaned with iodine and alcohol swabs.    The left quad tendon was targeted for Extracorporeal shockwave therapy.   Preset: patellar tendinopathy  Power Level: 90 Frequency: 10 Impulse/cycles: 3000 Head size: large    Patient did tolerate procedure well.     ASSESSMENT & PLAN:   Quadriceps tendinitis Completed third ECSWT  - f/u in one week.

## 2020-03-17 NOTE — Patient Instructions (Signed)
Good to see you Please try ice as needed   Please send me a message in MyChart with any questions or updates.  Please see me back in 1 week for shockwave therapy.   --Dr. Raeford Razor

## 2020-03-17 NOTE — Addendum Note (Signed)
Addended by: Cyd Silence on: 03/17/2020 09:27 AM   Modules accepted: Orders

## 2020-03-18 ENCOUNTER — Encounter: Payer: Self-pay | Admitting: Family Medicine

## 2020-03-24 ENCOUNTER — Ambulatory Visit: Payer: Self-pay | Admitting: Family Medicine

## 2020-03-24 ENCOUNTER — Other Ambulatory Visit: Payer: Self-pay

## 2020-03-24 DIAGNOSIS — M76899 Other specified enthesopathies of unspecified lower limb, excluding foot: Secondary | ICD-10-CM

## 2020-03-24 NOTE — Assessment & Plan Note (Signed)
Completed his fourth treatment today.  We will wait a month and if no improvement may need to consider referral to surgery.

## 2020-03-24 NOTE — Progress Notes (Signed)
°  Travis Rose - 50 y.o. male MRN [id number]  Date of birth: [birth year]  SUBJECTIVE:  Including CC & ROS.  No chief complaint on file.   Travis Rose is a 50 y.o. male that is presenting for fourth shockwave therapy.   Review of Systems See HPI   HISTORY: Past Medical, Surgical, Social, and Family History Reviewed & Updated per EMR.   Pertinent Historical Findings include:  Past Medical History:  Diagnosis Date   Allergy     Past Surgical History:  Procedure Laterality Date   NO PAST SURGERIES      Family History  Problem Relation Age of Onset   Allergic rhinitis Mother    Allergic rhinitis Brother    Asthma Brother    Angioedema Neg Hx    Eczema Neg Hx    Immunodeficiency Neg Hx    Urticaria Neg Hx    Other Neg Hx        gynecomastia    Social History   Socioeconomic History   Marital status: Single    Spouse name: Not on file   Number of children: Not on file   Years of education: Not on file   Highest education level: Not on file  Occupational History   Not on file  Tobacco Use   Smoking status: Former Smoker    Packs/day: 0.50    Years: 5.00    Pack years: 2.50    Types: Cigarettes    Quit date: 01/04/2002    Years since quitting: 18.2   Smokeless tobacco: Never Used  Vaping Use   Vaping Use: Never used  Substance and Sexual Activity   Alcohol use: Yes    Alcohol/week: 2.0 standard drinks    Types: 2 Cans of beer per week   Drug use: No   Sexual activity: Not on file  Other Topics Concern   Not on file  Social History Narrative   Not on file   Social Determinants of Health   Financial Resource Strain: Not on file  Food Insecurity: Not on file  Transportation Needs: Not on file  Physical Activity: Not on file  Stress: Not on file  Social Connections: Not on file  Intimate Partner Violence: Not on file     PHYSICAL EXAM:  VS: Ht 5\' 8"  (1.727 m)    Wt 168 lb (76.2 kg)    BMI 25.54 kg/m  Physical Exam Gen:  NAD, alert, cooperative with exam, well-appearing  Aspiration/Injection Procedure Note Travis Rose [birth year]  Procedure: ECSWT Indications: left quad pain   Procedure Details Consent: Risks of procedure as well as the alternatives and risks of each were explained to the (patient/caregiver).  Consent for procedure obtained. Time Out: Verified patient identification, verified procedure, site/side was marked, verified correct patient position, special equipment/implants available, medications/allergies/relevent history reviewed, required imaging and test results available.  Performed.  The area was cleaned with iodine and alcohol swabs.    The left quad tendon was targeted for Extracorporeal shockwave therapy.   Preset: Patellar tendinopathy Power Level: 110 Frequency: 10 Impulse/cycles: 3500 Head size: Large   Patient did tolerate procedure well.     ASSESSMENT & PLAN:   Quadriceps tendinitis Completed his fourth treatment today.  We will wait a month and if no improvement may need to consider referral to surgery.

## 2020-03-27 ENCOUNTER — Ambulatory Visit: Payer: 59 | Admitting: Physical Therapy

## 2020-03-28 ENCOUNTER — Other Ambulatory Visit: Payer: Self-pay

## 2020-03-28 ENCOUNTER — Encounter: Payer: Self-pay | Admitting: Physical Therapy

## 2020-03-28 ENCOUNTER — Ambulatory Visit: Payer: 59 | Attending: Orthopedic Surgery | Admitting: Physical Therapy

## 2020-03-28 DIAGNOSIS — R29898 Other symptoms and signs involving the musculoskeletal system: Secondary | ICD-10-CM | POA: Diagnosis present

## 2020-03-28 DIAGNOSIS — M25562 Pain in left knee: Secondary | ICD-10-CM | POA: Insufficient documentation

## 2020-03-28 DIAGNOSIS — G8929 Other chronic pain: Secondary | ICD-10-CM | POA: Diagnosis present

## 2020-03-28 NOTE — Patient Instructions (Addendum)
   Access Code: [email or web address] URL: [email or web address] Date: 03/28/2020 Prepared by: Annie Paras  Exercises Standing Quadriceps Stretch - 2-3 x daily - 7 x weekly - 3 reps - 30-60 sec hold ITB Stretch at Wall - 2-3 x daily - 7 x weekly - 3 reps - 30-60 sec hold    Kinesiology tape  What is kinesiology tape?  There are many brands of kinesiology tape. KTape, [employer], [employer], Dynamic tape, to name a few.  It is an elasticized tape designed to support the body's natural healing process. This tape provides stability and support to muscles and joints without restricting motion.  It can also help decrease swelling in the area of application.  How does it work?  The tape microscopically lifts and decompresses the skin to allow for drainage of lymph (swelling) to flow away from area, reducing inflammation. The tape has the ability to help re-educate the neuromuscular system by targeting specific receptors in the skin. The presence of the tape increases the body's awareness of posture and body mechanics.  Do not use with:  . Open wounds . Skin lesions . Adhesive allergies  In some rare cases, mild/moderate skin irritation can occur. This can include redness, itchiness, or hives. If this occurs, immediately remove tape and consult your primary care physician if symptoms are severe or do not resolve within 2 days.  Safe removal of the tape:  To remove tape safely, hold nearby skin with one hand and gentle roll tape down with other hand. You can apply oil or conditioner to tape while in shower prior to removal to loosen adhesive. DO NOT swiftly rip tape off like a band-aid, as this could cause skin tears and additional skin irritation.     For questions, please contact your therapist at:  [facility] [address]  [place], [place], [address] Phone: [phone]   Fax:  [phone]

## 2020-03-28 NOTE — Therapy (Signed)
[facility] [address]  [place], [place], [address] Phone: [phone]   Fax:  [phone]  Physical Therapy Evaluation  Patient Details  Name: Travis Rose MRN: [id number] Date of Birth: August 22, 1970 Referring Provider (PT): Edmonia Lynch, MD   Encounter Date: 03/28/2020   PT End of Session - 03/28/20 0850    Visit Number 1    Number of Visits 6    Authorization Type UHC    PT Start Time 0938    PT Stop Time 0948    PT Time Calculation (min) 58 min    Activity Tolerance Patient tolerated treatment well;No increased pain    Behavior During Therapy WFL for tasks assessed/performed           Past Medical History:  Diagnosis Date  . Allergy     Past Surgical History:  Procedure Laterality Date  . NO PAST SURGERIES      There were no vitals filed for this visit.    Subjective Assessment - 03/28/20 0853    Subjective About 1 yr ago he reports he "tweaked" his L knee at the gym while doing high knees on the TM. Pain sharp initially but then subsided and now seems to progress with certain workouts - worse with bike or elliptical motions. Has tried everything except PT - shockwave therapy, nitro patches, stretches, PRP, etc. Pain localized to quad tendon - starts to feel tight with burning senstation which eventually leads feeling that knee being misaligned.    Patient Stated Goals "to get back to working out without feeling limited by his knee"    Currently in Pain? No/denies    Pain Score 0-No pain   2-3/10 after workouts   Pain Location Knee    Pain Orientation Left;Anterior    Pain Descriptors / Indicators Tightness;Dull;Aching    Pain Type Chronic pain    Pain Radiating Towards occasional pain in L heel and btw his toes upon waking in the morning    Pain Onset More than a month ago   April 2021   Pain Frequency Intermittent    Aggravating Factors  ellipitcal, bike, leg extensions, squats, lateral  stability with rotational core exercises    Pain Relieving Factors Pennsaid, Aleve, ice followed by heat, stretches, rest    Effect of Pain on Daily Activities impacts ability to workout, favors L knee which sometimes lead to soreness in R knee, difficulty descending stairs - almost goes down sideways              [facility] PT Assessment - 03/28/20 0850      Assessment   Medical Diagnosis L quad tendinitis    Referring Provider (PT) Edmonia Lynch, MD    Onset Date/Surgical Date --   April 2021   Hand Dominance Right    Next MD Visit ~6 wks    Prior Therapy PT for R shoulder in 2020      Precautions   Precautions None      Restrictions   Weight Bearing Restrictions No      Balance Screen   Has the patient fallen in the past 6 months No    Has the patient had a decrease in activity level because of a fear of falling?  No    Is the patient reluctant to leave their home because of a fear of falling?  No      Home Social worker Private residence    Living  Arrangements Alone    Type of [place] to enter    Entrance Stairs-Number of Steps 5    Entrance Stairs-Rails Right    Home Layout One level      Prior Function   Level of Independence Independent    Vocation Full time employment    Vocation Requirements mostly [place] working out - at least 5x/wk, mountain biking, Control and instrumentation engineer   Overall Cognitive Status Within Functional Limits for tasks assessed      Observation/Other Assessments   Focus on Therapeutic Outcomes (FOTO)  Upper leg: FS = 84, predicted FS = 84      Posture/Postural Control   Posture/Postural Control Postural limitations    Posture Comments R genu recurvatum 2 leg length discrepancy (R shorter)      ROM / Strength   AROM / PROM / Strength AROM;Strength      AROM   AROM Assessment Site Knee    Right/Left Knee Right;Left    Right Knee Extension 0    Right Knee Flexion 130     Left Knee Extension 0    Left Knee Flexion 132      Strength   Strength Assessment Site Hip;Knee;Ankle    Right/Left Hip Right;Left    Right Hip Flexion 5/5    Right Hip Extension 5/5    Right Hip ABduction 5/5    Right Hip ADduction 5/5    Left Hip Flexion 5/5    Left Hip Extension 5/5    Left Hip ABduction 5/5    Left Hip ADduction 5/5    Right/Left Knee Right;Left    Right Knee Flexion 5/5    Right Knee Extension 5/5    Left Knee Flexion 5/5    Left Knee Extension 5/5    Right/Left Ankle Right;Left    Right Ankle Dorsiflexion 5/5    Right Ankle Plantar Flexion 5/5    Left Ankle Dorsiflexion 5/5    Left Ankle Plantar Flexion 5/5      Flexibility   Soft Tissue Assessment /Muscle Length yes      Palpation   Patella mobility increased discomfort with L superior patellar glide    Palpation comment increased tension over L quad tendon      Special Tests    Special Tests Hip Special Tests    Hip Special Tests  Ober's Test      Ober's Test   Findings Positive    Side Left;Right                      Objective measurements completed on examination: See above findings.       [facility] Adult PT Treatment/Exercise - 03/28/20 0850      Manual Therapy   Manual Therapy Taping    Kinesiotex Facilitate Muscle      Kinesiotix   Facilitate Muscle  Medial Glide performed while tape applied lateral to the patella. [place]                  PT Education - 03/28/20 1147    Education Details Education on need for rest from aggravating activity; education provided on Kinesio taping; education on HEP    Person(s) Educated Patient    Methods Explanation;Demonstration;Tactile cues;Verbal cues;Handout    Comprehension Verbalized understanding;Tactile cues required;Returned demonstration;Verbal cues required            PT Short  Term Goals - 03/28/20 1153      PT SHORT TERM GOAL #1   Title Patient will be independent with initial HEP    Status New     Target Date 04/11/20             PT Long Term Goals - 03/28/20 1154      PT LONG TERM GOAL #1   Title Patient will be independent with ongoing/advanced HEP    Status New    Target Date 04/25/20      PT LONG TERM GOAL #2   Title Pt. will report no pain in L Quad tendon following return to exercise    Status New    Target Date 04/25/20      PT LONG TERM GOAL #3   Title Pt. will score negative on B Ober's Test to demonstrate improvement in ITB flexibility    Status New    Target Date 04/25/20                  Plan - 03/28/20 1139    Clinical Impression Statement Travis Rose was referred to outpatient PT with quadriceps tendonitis. He reports chronic, intermittent pain above the knee after "tweaking" it last year. The pain is felt above and and sometimes around the knee following exercises that place repetitive stress on the L knee. He presenst with B LE strength 5/5, B Knee ROM WFL, but also with tightness in B ITB and discomfort when a superior patellar glide was assessed on L LE. Taping with a medial glid on the L patella was used to address potential lateral malalignment. Pt. will benefit from skilled PT to decrease L. knee pain following activity.    Examination-Activity Limitations Other;Squat;Stairs   Limited by pain in vigorous LE Exercises   Examination-Participation Restrictions Other    Stability/Clinical Decision Making Stable/Uncomplicated    Clinical Decision Making Low    Rehab Potential Good    PT Frequency Other (comment)   2x for first 2 weeks; 1x last 2 weeks   PT Duration 4 weeks    PT Treatment/Interventions Electrical Stimulation;Moist Heat;Ultrasound;Gait training;Functional mobility training;Therapeutic exercise;Therapeutic activities;Stair training;Manual techniques;Passive range of motion;Dry needling;Taping;Joint Manipulations;ADLs/Self Care Home Management;Iontophoresis 4mg /ml Dexamethasone;Cryotherapy;Neuromuscular re-education;Patient/family education     PT Next Visit Plan Assess VMO activation with extension next time, STM/DTM to Quadriceps tendon; Eval effectiveness of taping    PT Home Exercise Plan [email or web address] (03/28/2020)    Consulted and Agree with Plan of Care Patient           Patient will benefit from skilled therapeutic intervention in order to improve the following deficits and impairments:  Decreased activity tolerance,Pain,Impaired flexibility,Increased fascial restricitons  Visit Diagnosis: Chronic pain of left knee  Other symptoms and signs involving the musculoskeletal system     Problem List Patient Active Problem List   Diagnosis Date Noted  . Morton's neuroma of right foot 03/17/2020  . Chondral defect of left patella 01/30/2020  . Patellofemoral pain syndrome of left knee 12/06/2019  . Quadriceps tendinitis 07/25/2019  . Gynecomastia 02/16/2019  . Left shoulder pain 07/12/2018  . Labral tear of shoulder, right, initial encounter 07/12/2018  . Oral lichen planus 50/93/2671  . GAD (generalized anxiety disorder) 04/10/2018  . Seasonal allergic rhinitis 11/24/2017  . Allergic conjunctivitis 11/24/2017  . Mouth lesion 11/24/2017  . Eczema 10/28/2017  . Right arm pain 04/25/2017    Newman Nickels SPT 03/28/2020, 12:53 PM  [facility] [address]  [place], [place], [address] Phone: [phone]   Fax:  [phone]  Name: Travis Rose MRN: [id number] Date of Birth: July 27, 1970

## 2020-04-01 ENCOUNTER — Encounter: Payer: Self-pay | Admitting: Physical Therapy

## 2020-04-01 ENCOUNTER — Ambulatory Visit: Payer: 59 | Admitting: Physical Therapy

## 2020-04-01 ENCOUNTER — Other Ambulatory Visit: Payer: Self-pay

## 2020-04-01 DIAGNOSIS — G8929 Other chronic pain: Secondary | ICD-10-CM

## 2020-04-01 DIAGNOSIS — M25562 Pain in left knee: Secondary | ICD-10-CM

## 2020-04-01 DIAGNOSIS — R29898 Other symptoms and signs involving the musculoskeletal system: Secondary | ICD-10-CM

## 2020-04-01 NOTE — Patient Instructions (Signed)
    Access Code: [email or web address] URL: [email or web address] Date: 04/01/2020 Prepared by: Brumley - 3 x daily - 7 x weekly - 2 sets - 10 reps - 3 sec hold

## 2020-04-01 NOTE — Therapy (Signed)
[facility] [address]  [place], [place], [address] Phone: [phone]   Fax:  [phone]  Physical Therapy Treatment  Patient Details  Name: Travis Rose MRN: [id number] Date of Birth: 02-22-1970 Referring Provider (PT): Edmonia Lynch, MD   Encounter Date: 04/01/2020   PT End of Session - 04/01/20 1427    Visit Number 2    Number of Visits 6    Authorization Type UHC    PT Start Time 9702    PT Stop Time 1414    PT Time Calculation (min) 59 min    Activity Tolerance Patient tolerated treatment well    Behavior During Therapy [facility] for tasks assessed/performed           Past Medical History:  Diagnosis Date  . Allergy     Past Surgical History:  Procedure Laterality Date  . NO PAST SURGERIES      There were no vitals filed for this visit.   Subjective Assessment - 04/01/20 1320    Subjective Pt. reports pain in the quad tendon that moves all around his knee when he overuses it. He does report the tape is helping but still feels tightness throughout the day.    Diagnostic tests 08/08/19 L knee MRI: 1. Mild partial-thickness chondral surface irregularity involving  the midportion of the lateral patellar facet.  2. Mild tendinosis involving the distal quadriceps and proximal  patellar tendons without tear.  3. Intact menisci.  Intact cruciate and collateral ligaments.    Patient Stated Goals "to get back to working out without feeling limited by his knee"    Currently in Pain? No/denies    Pain Score 0-No pain    Pain Location Knee    Pain Orientation Left    Pain Descriptors / Indicators Tightness    Pain Type Chronic pain    Pain Onset More than a month ago   April 2021             [facility] PT Assessment - 04/01/20 0001      Observation/Other Assessments   Observations L VMO activation      Functional Tests   Functional tests Squat;Lunges;Other      Squat   Comments No form corrections;  brings on his pain      Lunges   Comments No form corrections; brings on his pain      Other:   Other/ Comments Side Lunges: No form corrections; brings on his pain                         [facility] Adult PT Treatment/Exercise - 04/01/20 1319      Exercises   Exercises Knee/Hip      Knee/Hip Exercises: Aerobic   Stationary Bike L3 x 6 minutes      Knee/Hip Exercises: Standing   Wall Squat 2 sets;10 reps;3 seconds    Wall Squat Limitations L tibia ER; Added ball in between for Hip Adduction      Manual Therapy   Manual Therapy Taping    Soft tissue mobilization Cross Friction Massage to L Quad Tendon    Kinesiotex Facilitate Muscle      Kinesiotix   Facilitate Muscle  Medial Glide performed while tape applied lateral to the patella. [place]                  PT Education - 04/01/20 1426    Education  Details Education on updated HEP    Person(s) Educated Patient    Methods Explanation;Demonstration;Handout;Verbal cues;Tactile cues    Comprehension Returned demonstration;Verbalized understanding;Tactile cues required;Verbal cues required            PT Short Term Goals - 04/01/20 1445      PT SHORT TERM GOAL #1   Title Patient will be independent with initial HEP    Status On-going    Target Date 04/11/20             PT Long Term Goals - 04/01/20 1445      PT LONG TERM GOAL #1   Status On-going      PT LONG TERM GOAL #2   Status On-going      PT LONG TERM GOAL #3   Status On-going                 Plan - 04/01/20 1428    Clinical Impression Statement [name] reports his L knee feels like it's swelling up when he overworks it. The pain originates in his L Quad tendon and spreads around his knee. He does report no pain at rest, only painful with certain movements. He presents with tightness in his L quad and his left VMO has less tone compared to R. He shows no restriction in functional movement, only that it brings pain or  stiffness in L quad tenson insertion. Strengthening exercises provided for L VMO activation. Pt. will continue to benefit from skilled PT to decrease pain and stiffness in his L knee to allow functional movement and prolonged exercise without pain.    Examination-Activity Limitations Other;Squat;Stairs   Limited by pain in vigorous LE Exercises   Examination-Participation Restrictions Other    Stability/Clinical Decision Making Stable/Uncomplicated    Rehab Potential Good    PT Frequency Other (comment)   2x for first 2 weeks; 1x last 2 weeks   PT Duration 4 weeks    PT Treatment/Interventions Electrical Stimulation;Moist Heat;Ultrasound;Gait training;Functional mobility training;Therapeutic exercise;Therapeutic activities;Stair training;Manual techniques;Passive range of motion;Dry needling;Taping;Joint Manipulations;ADLs/Self Care Home Management;Iontophoresis 4mg /ml Dexamethasone;Cryotherapy;Neuromuscular re-education;Patient/family education    PT Next Visit Plan STM/DTM to Quadriceps tendon; Progressing VMO activation/strengthening; Eccentric Quad strengthening; Assess stair navigation    PT Home Exercise Plan [email or web address] (03/28/2020); [id number] (04/01/2020)    Consulted and Agree with Plan of Care Patient           Patient will benefit from skilled therapeutic intervention in order to improve the following deficits and impairments:  Decreased activity tolerance,Pain,Impaired flexibility,Increased fascial restricitons  Visit Diagnosis: Chronic pain of left knee  Other symptoms and signs involving the musculoskeletal system     Problem List Patient Active Problem List   Diagnosis Date Noted  . Morton's neuroma of right foot 03/17/2020  . Chondral defect of left patella 01/30/2020  . Patellofemoral pain syndrome of left knee 12/06/2019  . Quadriceps tendinitis 07/25/2019  . Gynecomastia 02/16/2019  . Left shoulder pain 07/12/2018  . Labral tear of shoulder, right, initial encounter  07/12/2018  . Oral lichen planus 02/77/4128  . GAD (generalized anxiety disorder) 04/10/2018  . Seasonal allergic rhinitis 11/24/2017  . Allergic conjunctivitis 11/24/2017  . Mouth lesion 11/24/2017  . Eczema 10/28/2017  . Right arm pain 04/25/2017    Newman Nickels SPT 04/01/2020, 2:46 PM  [facility] [address]  [place], [place], [address] Phone: [phone]   Fax:  [phone]  Name: Oneal Schoenberger MRN: [id number] Date of Birth:  01/28/1970   

## 2020-04-04 ENCOUNTER — Ambulatory Visit: Payer: 59 | Attending: Orthopedic Surgery | Admitting: Physical Therapy

## 2020-04-04 ENCOUNTER — Other Ambulatory Visit: Payer: Self-pay

## 2020-04-04 ENCOUNTER — Encounter: Payer: Self-pay | Admitting: Physical Therapy

## 2020-04-04 DIAGNOSIS — M25562 Pain in left knee: Secondary | ICD-10-CM | POA: Insufficient documentation

## 2020-04-04 DIAGNOSIS — R29898 Other symptoms and signs involving the musculoskeletal system: Secondary | ICD-10-CM | POA: Diagnosis present

## 2020-04-04 DIAGNOSIS — G8929 Other chronic pain: Secondary | ICD-10-CM | POA: Diagnosis present

## 2020-04-04 NOTE — Therapy (Signed)
[facility] [address]  [place], [place], [address] Phone: [phone]   Fax:  [phone]  Physical Therapy Treatment  Patient Details  Name: Travis Rose MRN: [id number] Date of Birth: 1970/02/28 Referring Provider (PT): Edmonia Lynch, MD   Encounter Date: 04/04/2020   PT End of Session - 04/04/20 1013    Visit Number 3    Number of Visits 6    Authorization Type UHC    PT Start Time 0804    PT Stop Time 0908    PT Time Calculation (min) 64 min    Activity Tolerance Patient tolerated treatment well    Behavior During Therapy [facility] for tasks assessed/performed           Past Medical History:  Diagnosis Date  . Allergy     Past Surgical History:  Procedure Laterality Date  . NO PAST SURGERIES      There were no vitals filed for this visit.   Subjective Assessment - 04/04/20 0807    Subjective Pt. reports lateral stability with impact tweaks the pain. Felt L knee was working fine until going back into the gym last night and noticed pain into the L calf. The stretches have been going well along with the exercises. Doesn't feel the pain immediately after workouts, more hours later. 2/10 pain Following workout and it's a dull pain; heating pad does help.    Pain Score 0-No pain    Pain Location Knee    Pain Orientation Left                             [facility] Adult PT Treatment/Exercise - 04/04/20 0804      Knee/Hip Exercises: Stretches   Quad Stretch Left;2 reps;30 seconds    Quad Stretch Limitations Standing Stretch with single handrail assist      Knee/Hip Exercises: Aerobic   Stationary Bike L3 x 6 minutes      Knee/Hip Exercises: Machines for Strengthening   Cybex Knee Extension 2 sets 10 #10 B Concentric L Eccentric    Cybex Knee Flexion 2 sets 10 #10 B Concentric; L eccentric    Cybex Leg Press 2 sets 15 #25 B legs concentric; L leg eccentric      Knee/Hip Exercises:  Standing   Functional Squat Limitations --   One set in neutral; one with L Tibia in ER   Wall Squat 2 sets;10 reps    Wall Squat Limitations Wall squat with single leg raise; hand rail was used      Knee/Hip Exercises: Supine   Straight Leg Raises AROM;Left;10 reps;3 sets;Strengthening   3rd set a 2 pound ankle weight added   Straight Leg Raises Limitations L leg in 45 degrees ER      Knee/Hip Exercises: Sidelying   Hip ADduction AROM;10 reps;2 sets      Modalities   Modalities Electrical Stimulation      Electrical Stimulation   Electrical Stimulation Location L distal Quadriceps and L knee    Electrical Stimulation Action IFC    Electrical Stimulation Parameters 80-150Hz ; 15 minutes    Electrical Stimulation Goals Pain      Manual Therapy   Soft tissue mobilization Cross Friction Massage to [employer] Tendon                    PT Short Term Goals - 04/01/20 1445  PT SHORT TERM GOAL #1   Title Patient will be independent with initial HEP    Status On-going    Target Date 04/11/20             PT Long Term Goals - 04/04/20 1018      PT LONG TERM GOAL #1   Title Patient will be independent with ongoing/advanced HEP    Status On-going    Target Date 04/25/20      PT LONG TERM GOAL #2   Title Pt. will report no pain in L Quad tendon following return to exercise    Status On-going    Target Date 04/25/20      PT LONG TERM GOAL #3   Title Pt. will score negative on B Ober's Test to demonstrate improvement in ITB flexibility    Status On-going    Target Date 04/25/20                 Plan - 04/04/20 1013    Clinical Impression Statement Garris reports his L knee was feeling good until pushing it too hard during a workout last night. He was using his legs but for back workouts. His pain reached a 2/10 but he feels no pain at rest.  He presents with less tone in L quad compared to R. Therapeutic exercises were taught aimed at L VMO activation and L  Quad strengthening for hypertrophy. Pt. tolerated treatment well but needed cuing to focus on correct form during exercise. Pt. will continue to benefit from skilled PT to correct muscle imbalance and reduce pain for return to PLOF.    Examination-Activity Limitations Other;Squat;Stairs   Limited by pain in vigorous LE Exercises   Examination-Participation Restrictions Other    Stability/Clinical Decision Making Stable/Uncomplicated    Rehab Potential Good    PT Frequency Other (comment)   2x for first 2 weeks; 1x last 2 weeks   PT Duration 4 weeks    PT Treatment/Interventions Electrical Stimulation;Moist Heat;Ultrasound;Gait training;Functional mobility training;Therapeutic exercise;Therapeutic activities;Stair training;Manual techniques;Passive range of motion;Dry needling;Taping;Joint Manipulations;ADLs/Self Care Home Management;Iontophoresis 4mg /ml Dexamethasone;Cryotherapy;Neuromuscular re-education;Patient/family education    PT Next Visit Plan STM/DTM to Quadriceps tendon; Progressing VMO activation/strengthening; Eccentric Quad strengthening; Assess stair navigation    PT Home Exercise Plan [email or web address] (03/28/2020); [email or web address] (04/01/2020)    Consulted and Agree with Plan of Care Patient           Patient will benefit from skilled therapeutic intervention in order to improve the following deficits and impairments:  Decreased activity tolerance,Pain,Impaired flexibility,Increased fascial restricitons  Visit Diagnosis: Chronic pain of left knee  Other symptoms and signs involving the musculoskeletal system     Problem List Patient Active Problem List   Diagnosis Date Noted  . Morton's neuroma of right foot 03/17/2020  . Chondral defect of left patella 01/30/2020  . Patellofemoral pain syndrome of left knee 12/06/2019  . Quadriceps tendinitis 07/25/2019  . Gynecomastia 02/16/2019  . Left shoulder pain 07/12/2018  . Labral tear of shoulder, right, initial encounter 07/12/2018  .  Oral lichen planus 01/60/1093  . GAD (generalized anxiety disorder) 04/10/2018  . Seasonal allergic rhinitis 11/24/2017  . Allergic conjunctivitis 11/24/2017  . Mouth lesion 11/24/2017  . Eczema 10/28/2017  . Right arm pain 04/25/2017    Newman Nickels SPT 04/04/2020, 11:35 AM  [facility] [address]  [place], [place], [address] Phone: [phone]   Fax:  [phone]  Name: Shadd Dunstan MRN: [id number] Date  of Birth: [birth year]

## 2020-04-08 ENCOUNTER — Other Ambulatory Visit: Payer: Self-pay

## 2020-04-08 ENCOUNTER — Encounter: Payer: Self-pay | Admitting: Physical Therapy

## 2020-04-08 ENCOUNTER — Ambulatory Visit: Payer: 59 | Admitting: Physical Therapy

## 2020-04-08 DIAGNOSIS — M25562 Pain in left knee: Secondary | ICD-10-CM | POA: Diagnosis not present

## 2020-04-08 DIAGNOSIS — R29898 Other symptoms and signs involving the musculoskeletal system: Secondary | ICD-10-CM

## 2020-04-08 DIAGNOSIS — G8929 Other chronic pain: Secondary | ICD-10-CM

## 2020-04-08 NOTE — Therapy (Addendum)
[facility] [address]  [place], [place], [address] Phone: [phone]   Fax:  [phone]  Physical Therapy Treatment / Discharge Summary  Patient Details  Name: Travis Rose MRN: [id number] Date of Birth: 09/21/1970 Referring Provider (PT): Edmonia Lynch, MD   Encounter Date: 04/08/2020   PT End of Session - 04/08/20 0948    Visit Number 4    Number of Visits 6    Date for PT Re-Evaluation 04/25/20    Authorization Type UHC    PT Start Time 0851    PT Stop Time 0937    PT Time Calculation (min) 46 min    Activity Tolerance Patient tolerated treatment well    Behavior During Therapy [facility] for tasks assessed/performed           Past Medical History:  Diagnosis Date  . Allergy     Past Surgical History:  Procedure Laterality Date  . NO PAST SURGERIES      There were no vitals filed for this visit.   Subjective Assessment - 04/08/20 0853    Subjective Pt. reports still feeling pain a day after last session. This was throughout the L knee. Reports tightness as usual but this goes away throughout the day when he isn't using it. Reports he does feel better overall since beginning of therapy but aggravating activities still bother.    Currently in Pain? No/denies    Pain Score 0-No pain    Pain Location Knee    Pain Orientation Left    Pain Descriptors / Indicators Tightness              [facility] PT Assessment - 04/08/20 0852      Palpation   Patella mobility No discomfort with superior glide; no difference in mobility between L and R      Ambulation/Gait   Gait Comments Pt. reports pain around L knee during mid to end range of knee flexion when descending stairs using eccentric quad control; has knee over toes during decent; no othe abnormalities noted.                         [facility] Adult PT Treatment/Exercise - 04/08/20 0852      Ambulation/Gait   Stairs Yes    Stairs  Assistance 7: Independent    Stair Management Technique No rails;Forwards    Number of Stairs 14   3 sets   Height of Stairs 7      Exercises   Exercises Knee/Hip      Knee/Hip Exercises: Aerobic   Stationary Bike L5 x 6 minutes      Knee/Hip Exercises: Machines for Strengthening   Cybex Knee Extension 3 sets 8 #15 B Concentric L Eccentric   Strengthening   Cybex Knee Flexion 3 sets 8 #15    Cybex Leg Press 1 set 8 #25; 2 sets 8 #35   3rd set L leg in 45 degrees ER to target VMO; B leg concentric and L Leg eccentric for strengthening     Knee/Hip Exercises: Standing   Step Down Right;2 sets;15 reps    Step Down Limitations Lateral step down with heel touch      Knee/Hip Exercises: Supine   Straight Leg Raises AROM;Left;10 reps;3 sets;Strengthening   2nd and 3rd set a 2 pound ankle weight added   Straight Leg Raises Limitations L leg in 45 degrees ER      Knee/Hip  Exercises: Sidelying   Hip ADduction AROM;10 reps;2 sets      Manual Therapy   Soft tissue mobilization Cross Friction Massage to L Quad Tendon   Less tightness in L quad tendon                 PT Education - 04/08/20 1001    Education Details Education provided to reiterate importance of rest from aggravating activities    Person(s) Educated Patient    Methods Explanation;Demonstration;Verbal cues;Tactile cues    Comprehension Verbalized understanding;Returned demonstration            PT Short Term Goals - 04/01/20 1445      PT SHORT TERM GOAL #1   Title Patient will be independent with initial HEP    Status On-going    Target Date 04/11/20             PT Long Term Goals - 04/04/20 1018      PT LONG TERM GOAL #1   Title Patient will be independent with ongoing/advanced HEP    Status On-going    Target Date 04/25/20      PT LONG TERM GOAL #2   Title Pt. will report no pain in L Quad tendon following return to exercise    Status On-going    Target Date 04/25/20      PT LONG TERM GOAL  #3   Title Pt. will score negative on B Ober's Test to demonstrate improvement in ITB flexibility    Status On-going    Target Date 04/25/20                 Plan - 04/08/20 0949    Clinical Impression Statement Travis Rose felt pain and stiffness in his L knee on Saturday about 2/10 that decreased by Sunday following treatment and exercises Friday. He does feel PT is helping and notices rest from aggravating activity and alternative exercises provided in PT are less irritating and ease pain. He modified his physical activity but still performs provoking exercises. Education was provided on which activities he should avoid from these and pt instructed to rest from wall squats from HEP. He presents with less stiffness in his L quad tendon and demonstrates a compensated gait on his L LE when descending stairs and during lateral heel taps. He performed exercises better today and understands his HEP. [place] will benefit from skilled PT to decrease his pain and return to PLOF if he is able to rest from aggravating activity between sessions.    Examination-Activity Limitations Other;Squat;Stairs   Limited by pain in vigorous LE Exercises   Examination-Participation Restrictions Other    Stability/Clinical Decision Making Stable/Uncomplicated    Rehab Potential Good    PT Frequency Other (comment)   2x for first 2 weeks; 1x last 2 weeks   PT Duration 4 weeks    PT Treatment/Interventions Electrical Stimulation;Moist Heat;Ultrasound;Gait training;Functional mobility training;Therapeutic exercise;Therapeutic activities;Stair training;Manual techniques;Passive range of motion;Dry needling;Taping;Joint Manipulations;ADLs/Self Care Home Management;Iontophoresis 36m/ml Dexamethasone;Cryotherapy;Neuromuscular re-education;Patient/family education    PT Next Visit Plan STM/DTM to Quadriceps tendon; Stretching for ITB and quad/hamstrings; Progressing VMO activation/strengthening; Eccentric Quad strengthening;  further education on rest from aggravating activities.    PT Home Exercise Plan [email or web address] (03/28/2020); [id number](04/01/2020)    Consulted and Agree with Plan of Care Patient           Patient will benefit from skilled therapeutic intervention in order to improve the following deficits and impairments:  Decreased activity tolerance,Pain,Impaired flexibility,Increased fascial restricitons  Visit Diagnosis: Chronic pain of left knee  Other symptoms and signs involving the musculoskeletal system     Problem List Patient Active Problem List   Diagnosis Date Noted  . Morton's neuroma of right foot 03/17/2020  . Chondral defect of left patella 01/30/2020  . Patellofemoral pain syndrome of left knee 12/06/2019  . Quadriceps tendinitis 07/25/2019  . Gynecomastia 02/16/2019  . Left shoulder pain 07/12/2018  . Labral tear of shoulder, right, initial encounter 07/12/2018  . Oral lichen planus 99/80/6999  . GAD (generalized anxiety disorder) 04/10/2018  . Seasonal allergic rhinitis 11/24/2017  . Allergic conjunctivitis 11/24/2017  . Mouth lesion 11/24/2017  . Eczema 10/28/2017  . Right arm pain 04/25/2017    Newman Nickels SPT 04/08/2020, 10:30 AM  [facility] [address]  [place], [place], [address] Phone: [phone]   Fax:  [phone]  Name: Sabin Gibeault MRN: [id number] Date of Birth: [birth year]  PHYSICAL THERAPY DISCHARGE SUMMARY  Visits from Start of Care: 4  Current functional level related to goals / functional outcomes:   Refer to above clinical impression for status as of last visit on 04/08/2020. Patient cancelled all remaining appointments wanting to wait to see what his OOP expense for PT would be once he received the bill and has not rescheduled or returned to PT in >30 days, therefore will proceed with discharge from PT for this episode.   Remaining deficits:   As above. Unable to formally assess  status at discharge due to failure to return to PT   Education / Equipment:   HEP  Plan: Patient agrees to discharge.  Patient goals were not met. Patient is being discharged due to not returning since the last visit.  ?????     Percival Spanish, PT, MPT 06/11/20, 9:39 AM  [facility] [address]  [place], [place], [address] Phone: [phone]   Fax:  [phone]

## 2020-04-11 ENCOUNTER — Ambulatory Visit: Payer: 59

## 2020-04-15 ENCOUNTER — Ambulatory Visit: Payer: 59 | Admitting: Physical Therapy

## 2020-04-22 ENCOUNTER — Ambulatory Visit: Payer: 59 | Admitting: Physical Therapy

## 2020-08-08 ENCOUNTER — Other Ambulatory Visit: Payer: Self-pay | Admitting: Family Medicine

## 2020-08-15 ENCOUNTER — Other Ambulatory Visit: Payer: Self-pay | Admitting: Sports Medicine

## 2020-08-15 DIAGNOSIS — M25562 Pain in left knee: Secondary | ICD-10-CM

## 2020-08-20 ENCOUNTER — Ambulatory Visit
Admission: RE | Admit: 2020-08-20 | Discharge: 2020-08-20 | Disposition: A | Discharge status: Home / Self Care | Payer: 59 | Source: Ambulatory Visit | Attending: Sports Medicine | Admitting: Sports Medicine

## 2020-08-20 DIAGNOSIS — M25562 Pain in left knee: Secondary | ICD-10-CM

## 2020-10-02 ENCOUNTER — Ambulatory Visit (INDEPENDENT_AMBULATORY_CARE_PROVIDER_SITE_OTHER): Payer: 59 | Admitting: Physical Therapy

## 2020-10-02 ENCOUNTER — Other Ambulatory Visit: Payer: Self-pay

## 2020-10-02 DIAGNOSIS — M25562 Pain in left knee: Secondary | ICD-10-CM | POA: Diagnosis not present

## 2020-10-07 ENCOUNTER — Ambulatory Visit (INDEPENDENT_AMBULATORY_CARE_PROVIDER_SITE_OTHER): Payer: 59 | Admitting: Physical Therapy

## 2020-10-07 ENCOUNTER — Encounter: Payer: Self-pay | Admitting: Physical Therapy

## 2020-10-07 ENCOUNTER — Other Ambulatory Visit: Payer: Self-pay

## 2020-10-07 DIAGNOSIS — M25562 Pain in left knee: Secondary | ICD-10-CM

## 2020-10-07 NOTE — Therapy (Signed)
[facility] [address] [place], [place], [address] Phone: [phone]   Fax:  [phone]  Physical Therapy Evaluation  Patient Details  Name: [place] Rose MRN: [id number] Date of Birth: [birth year] Referring Provider (PT): Teresa Coombs   Encounter Date: 10/02/2020   PT End of Session - 10/07/20 2150     Visit Number 1    Number of Visits 16    Date for PT Re-Evaluation 11/27/20    Authorization Type UHC    PT Start Time 1217    PT Stop Time 1300    PT Time Calculation (min) 43 min    Activity Tolerance Patient tolerated treatment well    Behavior During Therapy [facility] for tasks assessed/performed             Past Medical History:  Diagnosis Date   Allergy     Past Surgical History:  Procedure Laterality Date   NO PAST SURGERIES      There were no vitals filed for this visit.    Subjective Assessment - 10/07/20 2144     Subjective Pt states chronic L quad tendinopathy, with trials of previous rehab.  He  had tenex procedure and PRP injection at [facility] on 09/26/20. Today he is using crutch, has steri strips in place, at 1 incision site. States soreness in lateral quad region, as well as continued soreness in knee joint line. 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 He likes to be very active, normally exercises daily- likes lifting, cardio. Also states recent increase in R metatarsal pain, did have injection, but still sore in toes 2-3, mostly with increased weight bearing and push off.    Pertinent History 09/26/20   S/P Tenex Partial percutaneous tenotomy of the left quad tendon, PRP injection    Limitations Standing;Walking;House hold activities;Lifting    Patient Stated Goals decreased pain, return to activity    Currently in Pain? Yes    Pain Score 3     Pain Location Knee    Pain Orientation Left    Pain Descriptors / Indicators Aching    Pain Type Chronic pain    Pain  Onset More than a month ago    Pain Frequency Intermittent                [facility] PT Assessment - 10/07/20 0001       Assessment   Medical Diagnosis L knee pain    Referring Provider (PT) Teresa Coombs    Onset Date/Surgical Date 09/26/20    Prior Therapy last year      Precautions   Precautions None      Balance Screen   Has the patient fallen in the past 6 months No      Prior Function   Level of Independence Independent      Cognition   Overall Cognitive Status Within Functional Limits for tasks assessed      ROM / Strength   AROM / PROM / Strength AROM;Strength      AROM   AROM Assessment Site Knee    Right/Left Knee Left;Right    Right Knee Extension 0    Right Knee Flexion 133    Left Knee Extension 0    Left Knee Flexion 90      Strength   Overall Strength Comments Strength of L knee not tested;  L hip: 4+/5    Strength Assessment Site Knee  Right/Left Knee Left      Palpation   Palpation comment 1 healing incision at distal, central quad, with steri strips in place. Tenderness in Lateral quad.      Special Tests   Other special tests not tested due to surgery                        Objective measurements completed on examination: See above findings.       [facility] Adult PT Treatment/Exercise - 10/07/20 0001       Exercises   Exercises Knee/Hip      Knee/Hip Exercises: Stretches   Active Hamstring Stretch 2 reps;30 seconds;Left;Right    Gastroc Stretch 30 seconds;2 reps;Both      Knee/Hip Exercises: Supine   Heel Slides 10 reps;Left    Straight Leg Raises 10 reps;Right;Left      Knee/Hip Exercises: Sidelying   Hip ABduction 10 reps;Both      Manual Therapy   Manual Therapy Passive ROM    Passive ROM for L knee flex/ext , very light manual quad stretch in prone                     PT Education - 10/07/20 2150     Education Details PT POC, Exam findings.    Person(s) Educated Patient    Methods  Explanation;Demonstration;Tactile cues;Verbal cues;Handout    Comprehension Verbalized understanding;Returned demonstration;Verbal cues required;Tactile cues required;Need further instruction              PT Short Term Goals - 10/07/20 2157       PT SHORT TERM GOAL #1   Title Patient will be independent with initial HEP    Time 2    Period Weeks    Status New    Target Date 10/16/20               PT Long Term Goals - 10/07/20 2158       PT LONG TERM GOAL #1   Title Patient will be independent with final  HEP    Time 8    Period Weeks    Status New    Target Date 11/27/20      PT LONG TERM GOAL #2   Title Pt to report decreased pain in L knee to 0-2/10 with standing, walking, and exercise for at least 30 min    Time 8    Period Weeks    Status New    Target Date 11/27/20      PT LONG TERM GOAL #3   Title Pt to demo L quad length to be WNL/equal to R.    Time 6    Period Weeks    Status New    Target Date 11/13/20      PT LONG TERM GOAL #4   Title Pt to demo full functional strength of L quad, with squats, stairs, and single leg activity, to improve ability for return to exercise, community, and functional activites without pain and to reduce risk for re-injury    Time 8    Period Weeks    Status New    Target Date 11/27/20                    Plan - 10/07/20 2205     Clinical Impression Statement Pt presents with primary complaint of increased pain in L knee. He has had chronic pain, but had  tenex of L quad  tendon (and PRP injection) on 9/23. Pt with limited ROM and length of L quad, with increased soreness. He is able to ambulate with good gait mechanics, without use of AD today. Discussed rehab and protocol guidelines today , for optimal healing and outcome. Pt with significant limitation in ability for functional ability and exercise at this time.Pt to benefit from skilled PT to improve deficits and return to PLOF without pain.    Personal  Factors and Comorbidities Time since onset of injury/illness/exacerbation;Past/Current Experience    Examination-Activity Limitations Bend;Squat;Stairs;Carry;Stand;Lift;Locomotion Level    Examination-Participation Restrictions Cleaning;Community Activity;Shop;Driving;Yard Work;Laundry;Meal Prep;Occupation    Stability/Clinical Decision Making Stable/Uncomplicated    Clinical Decision Making Low    Rehab Potential Good    PT Frequency 2x / week    PT Duration 6 weeks    PT Treatment/Interventions ADLs/Self Care Home Management;Cryotherapy;Electrical Stimulation;Iontophoresis 4mg /ml Dexamethasone;Moist Heat;Traction;Balance training;Therapeutic exercise;Therapeutic activities;Functional mobility training;Stair training;Gait training;DME Instruction;Parrafin;Ultrasound;Neuromuscular re-education;Patient/family education;Orthotic Fit/Training;Vasopneumatic Device;Taping;Dry needling;Passive range of motion;Joint Manipulations;Spinal Manipulations;Manual techniques    PT Home Exercise Plan [place] and Agree with Plan of Care Patient             Patient will benefit from skilled therapeutic intervention in order to improve the following deficits and impairments:  Abnormal gait, Hypomobility, Decreased activity tolerance, Decreased strength, Pain, Increased fascial restricitons, Decreased balance, Decreased mobility, Increased muscle spasms, Decreased range of motion, Impaired flexibility  Visit Diagnosis: Acute pain of left knee     Problem List Patient Active Problem List   Diagnosis Date Noted   Morton's neuroma of right foot 03/17/2020   Chondral defect of left patella 01/30/2020   Patellofemoral pain syndrome of left knee 12/06/2019   Quadriceps tendinitis 07/25/2019   Gynecomastia 02/16/2019   Left shoulder pain 07/12/2018   Labral tear of shoulder, right, initial encounter 38/45/3646   Oral lichen planus 80/32/1224   GAD (generalized anxiety disorder) 04/10/2018    Seasonal allergic rhinitis 11/24/2017   Allergic conjunctivitis 11/24/2017   Mouth lesion 11/24/2017   Eczema 10/28/2017   Right arm pain 04/25/2017    Lyndee Hensen, PT, DPT 10:17 PM  10/07/20    [facility] [address] [place], [place], [address] Phone: [phone]   Fax:  [phone]  Name: Travis Rose MRN: [id number] Date of Birth: 1970-07-25

## 2020-10-07 NOTE — Patient Instructions (Signed)
Access Code: [facility] URL: [email or web address] Date: 10/07/2020 Prepared by: Lyndee Hensen  Exercises Straight Leg Raise - 1 x daily - 2 sets - 10 reps Sidelying Hip Abduction - 1 x daily - 2 sets - 10 reps  Seated Hamstring Stretch - 2 x daily - 3 reps - 30 hold Gastroc Stretch on Wall - 2 x daily - 3 reps - 30 hold Supine Heel Slide - 1 x daily - 1 sets - 10 reps

## 2020-10-07 NOTE — Therapy (Signed)
[facility] [address] [place], [place], [address] Phone: [phone]   Fax:  [phone]  Physical Therapy Treatment  Patient Details  Name: [place] Rose MRN: [id number] Date of Birth: 04-16-1970 Referring Provider (PT): Teresa Coombs   Encounter Date: 10/07/2020   PT End of Session - 10/07/20 2224     Visit Number 2    Number of Visits 16    Date for PT Re-Evaluation 11/27/20    Authorization Type UHC    PT Start Time 1017    PT Stop Time 1100    PT Time Calculation (min) 43 min    Activity Tolerance Patient tolerated treatment well    Behavior During Therapy [facility] for tasks assessed/performed             Past Medical History:  Diagnosis Date   Allergy     Past Surgical History:  Procedure Laterality Date   NO PAST SURGERIES      There were no vitals filed for this visit.   Subjective Assessment - 10/07/20 2224     Subjective Pt not using crutches this week, states doing very well. Has some soreness in lateral quad, as well as in knee joint line.    Patient Stated Goals decreased pain, return to activity    Currently in Pain? Yes    Pain Score 3     Pain Location Knee    Pain Orientation Left    Pain Descriptors / Indicators Aching    Pain Type Acute pain    Pain Onset More than a month ago    Pain Frequency Intermittent                               [facility] Adult PT Treatment/Exercise - 10/07/20 2228       Exercises   Exercises Knee/Hip      Knee/Hip Exercises: Stretches   Other Knee/Hip Stretches Very light quad stretch, prone w strap 10 sec x 5;      Knee/Hip Exercises: Supine   Quad Sets 15 reps;Left    Heel Slides Left;15 reps    Straight Leg Raises Left;20 reps      Knee/Hip Exercises: Sidelying   Hip ABduction 20 reps;Left      Knee/Hip Exercises: Prone   Hip Extension 20 reps;Both      Manual Therapy   Manual Therapy Passive ROM;Soft tissue mobilization     Manual therapy comments very light STM to lateral quad    Passive ROM for L knee flex/ext , very light manual quad stretch in prone                       PT Short Term Goals - 10/07/20 2157       PT SHORT TERM GOAL #1   Title Patient will be independent with initial HEP    Time 2    Period Weeks    Status New    Target Date 10/16/20               PT Long Term Goals - 10/07/20 2158       PT LONG TERM GOAL #1   Title Patient will be independent with final  HEP    Time 8    Period Weeks    Status New    Target Date 11/27/20      PT LONG TERM GOAL #2  Title Pt to report decreased pain in L knee to 0-2/10 with standing, walking, and exercise for at least 30 min    Time 8    Period Weeks    Status New    Target Date 11/27/20      PT LONG TERM GOAL #3   Title Pt to demo L quad length to be WNL/equal to R.    Time 6    Period Weeks    Status New    Target Date 11/13/20      PT LONG TERM GOAL #4   Title Pt to demo full functional strength of L quad, with squats, stairs, and single leg activity, to improve ability for return to exercise, community, and functional activites without pain and to reduce risk for re-injury    Time 8    Period Weeks    Status New    Target Date 11/27/20                   Plan - 10/07/20 2226     Clinical Impression Statement Pt with good ability for ambulation without AD today, wtih good mechanics. Continued to discuss PT plan and protocol for upcoming weeks, as well as activities to avoid. Pt with good ability for quad contraction. He does have tightness and limitation in L quad, will continue to progress to full ROM as tolerated.  He has most soreness in lateral quad, light STM done today. Pt progressing well per protocol this week. Plan to progress as tolerated.    Personal Factors and Comorbidities Time since onset of injury/illness/exacerbation;Past/Current Experience    Examination-Activity Limitations  Bend;Squat;Stairs;Carry;Stand;Lift;Locomotion Level    Examination-Participation Restrictions Cleaning;Community Activity;Shop;Driving;Yard Work;Laundry;Meal Prep;Occupation    Stability/Clinical Decision Making Stable/Uncomplicated    Rehab Potential Good    PT Frequency 2x / week    PT Duration 6 weeks    PT Treatment/Interventions ADLs/Self Care Home Management;Cryotherapy;Electrical Stimulation;Iontophoresis 4mg /ml Dexamethasone;Moist Heat;Traction;Balance training;Therapeutic exercise;Therapeutic activities;Functional mobility training;Stair training;Gait training;DME Instruction;Parrafin;Ultrasound;Neuromuscular re-education;Patient/family education;Orthotic Fit/Training;Vasopneumatic Device;Taping;Dry needling;Passive range of motion;Joint Manipulations;Spinal Manipulations;Manual techniques    PT Home Exercise Plan [place] and Agree with Plan of Care Patient             Patient will benefit from skilled therapeutic intervention in order to improve the following deficits and impairments:  Abnormal gait, Hypomobility, Decreased activity tolerance, Decreased strength, Pain, Increased fascial restricitons, Decreased balance, Decreased mobility, Increased muscle spasms, Decreased range of motion, Impaired flexibility  Visit Diagnosis: Acute pain of left knee     Problem List Patient Active Problem List   Diagnosis Date Noted   Morton's neuroma of right foot 03/17/2020   Chondral defect of left patella 01/30/2020   Patellofemoral pain syndrome of left knee 12/06/2019   Quadriceps tendinitis 07/25/2019   Gynecomastia 02/16/2019   Left shoulder pain 07/12/2018   Labral tear of shoulder, right, initial encounter 39/03/90   Oral lichen planus 33/00/7622   GAD (generalized anxiety disorder) 04/10/2018   Seasonal allergic rhinitis 11/24/2017   Allergic conjunctivitis 11/24/2017   Mouth lesion 11/24/2017   Eczema 10/28/2017   Right arm pain 04/25/2017    Lyndee Hensen, PT, DPT 10:30 PM  10/07/20    [facility] [address] [place], [place], [address] Phone: [phone]   Fax:  [phone]  Name: Travis Rose MRN: [id number] Date of Birth: [birth year]

## 2020-10-16 ENCOUNTER — Ambulatory Visit (INDEPENDENT_AMBULATORY_CARE_PROVIDER_SITE_OTHER): Payer: 59 | Admitting: Physical Therapy

## 2020-10-16 ENCOUNTER — Other Ambulatory Visit: Payer: Self-pay

## 2020-10-16 DIAGNOSIS — M25562 Pain in left knee: Secondary | ICD-10-CM

## 2020-10-17 ENCOUNTER — Encounter: Payer: Self-pay | Admitting: Physical Therapy

## 2020-10-17 NOTE — Therapy (Signed)
[facility] [address] [place], [place], [address] Phone: [phone]   Fax:  [phone]  Physical Therapy Treatment  Patient Details  Name: Travis Rose MRN: [id number] Date of Birth: 04-Apr-1970 Referring Provider (PT): Teresa Coombs   Encounter Date: 10/16/2020   PT End of Session - 10/17/20 2200     Visit Number 3    Number of Visits 16    Date for PT Re-Evaluation 11/27/20    Authorization Type UHC    PT Start Time 1350    PT Stop Time 1430    PT Time Calculation (min) 40 min    Activity Tolerance Patient tolerated treatment well    Behavior During Therapy [facility] for tasks assessed/performed             Past Medical History:  Diagnosis Date   Allergy     Past Surgical History:  Procedure Laterality Date   NO PAST SURGERIES      There were no vitals filed for this visit.   Subjective Assessment - 10/17/20 2159     Subjective Pt states doing well. Has some concern for "knot" at distal quad. Is seeing MD for f/u tomorrow.    Currently in Pain? Yes    Pain Score 3     Pain Location Knee    Pain Orientation Left    Pain Descriptors / Indicators Aching    Pain Type Acute pain    Pain Onset More than a month ago    Pain Frequency Intermittent                               [facility] Adult PT Treatment/Exercise - 10/17/20 0001       Knee/Hip Exercises: Stretches   Gastroc Stretch 30 seconds;2 reps;Both      Knee/Hip Exercises: Aerobic   Recumbent Bike L1 x 8 min      Knee/Hip Exercises: Standing   Heel Raises 20 reps    Forward Step Up 10 reps;Both;Hand Hold: 0;Step Height: 6"    Wall Squat Limitations 20 sec x 5;    SLS with Vectors SLS with UE flexion and head rotation x 10 each bil;      Knee/Hip Exercises: Seated   Sit to Sand 10 reps      Knee/Hip Exercises: Supine   Straight Leg Raises 20 reps;Left      Manual Therapy   Passive ROM for L knee flex/ext , manual(light)   quad stretch in prone                       PT Short Term Goals - 10/07/20 2157       PT SHORT TERM GOAL #1   Title Patient will be independent with initial HEP    Time 2    Period Weeks    Status New    Target Date 10/16/20               PT Long Term Goals - 10/07/20 2158       PT LONG TERM GOAL #1   Title Patient will be independent with final  HEP    Time 8    Period Weeks    Status New    Target Date 11/27/20      PT LONG TERM GOAL #2   Title Pt to report decreased pain in L knee to 0-2/10 with standing, walking, and  exercise for at least 30 min    Time 8    Period Weeks    Status New    Target Date 11/27/20      PT LONG TERM GOAL #3   Title Pt to demo L quad length to be WNL/equal to R.    Time 6    Period Weeks    Status New    Target Date 11/13/20      PT LONG TERM GOAL #4   Title Pt to demo full functional strength of L quad, with squats, stairs, and single leg activity, to improve ability for return to exercise, community, and functional activites without pain and to reduce risk for re-injury    Time 8    Period Weeks    Status New    Target Date 11/27/20                   Plan - 10/17/20 2201     Clinical Impression Statement Pt with improved gait pattern today, no AD. He has much improved knee ROM, nearing normal flexion. He does have mild tightness for quad length, but also improved from last week. He has good ability for progression of strength today, without increased pain. I do not see or feel anything concerning in quad today(pt was worried about knot feeling ).He also was concerned for warmness of knee, i do not see or feel anything concerning for infection today, no rednees, swelling, warmth, etc.  He is progressing well per protocol.    Personal Factors and Comorbidities Time since onset of injury/illness/exacerbation;Past/Current Experience    Examination-Activity Limitations  Bend;Squat;Stairs;Carry;Stand;Lift;Locomotion Level    Examination-Participation Restrictions Cleaning;Community Activity;Shop;Driving;Yard Work;Laundry;Meal Prep;Occupation    Stability/Clinical Decision Making Stable/Uncomplicated    Rehab Potential Good    PT Frequency 2x / week    PT Duration 6 weeks    PT Treatment/Interventions ADLs/Self Care Home Management;Cryotherapy;Electrical Stimulation;Iontophoresis 4mg /ml Dexamethasone;Moist Heat;Traction;Balance training;Therapeutic exercise;Therapeutic activities;Functional mobility training;Stair training;Gait training;DME Instruction;Parrafin;Ultrasound;Neuromuscular re-education;Patient/family education;Orthotic Fit/Training;Vasopneumatic Device;Taping;Dry needling;Passive range of motion;Joint Manipulations;Spinal Manipulations;Manual techniques    PT Home Exercise Plan [place] and Agree with Plan of Care Patient             Patient will benefit from skilled therapeutic intervention in order to improve the following deficits and impairments:  Abnormal gait, Hypomobility, Decreased activity tolerance, Decreased strength, Pain, Increased fascial restricitons, Decreased balance, Decreased mobility, Increased muscle spasms, Decreased range of motion, Impaired flexibility  Visit Diagnosis: Acute pain of left knee     Problem List Patient Active Problem List   Diagnosis Date Noted   Morton's neuroma of right foot 03/17/2020   Chondral defect of left patella 01/30/2020   Patellofemoral pain syndrome of left knee 12/06/2019   Quadriceps tendinitis 07/25/2019   Gynecomastia 02/16/2019   Left shoulder pain 07/12/2018   Labral tear of shoulder, right, initial encounter 63/33/5456   Oral lichen planus 25/63/8937   GAD (generalized anxiety disorder) 04/10/2018   Seasonal allergic rhinitis 11/24/2017   Allergic conjunctivitis 11/24/2017   Mouth lesion 11/24/2017   Eczema 10/28/2017   Right arm pain 04/25/2017    Lyndee Hensen, PT, DPT 10:04 PM  10/17/20    [facility] [address] [place], [place], [address] Phone: [phone]   Fax:  [phone]  Name: Travis Rose MRN: [id number] Date of Birth: October 17, 1970

## 2020-10-20 ENCOUNTER — Ambulatory Visit (INDEPENDENT_AMBULATORY_CARE_PROVIDER_SITE_OTHER): Payer: 59 | Admitting: Physical Therapy

## 2020-10-20 ENCOUNTER — Other Ambulatory Visit: Payer: Self-pay

## 2020-10-20 DIAGNOSIS — M25562 Pain in left knee: Secondary | ICD-10-CM

## 2020-10-21 ENCOUNTER — Encounter: Payer: Self-pay | Admitting: Physical Therapy

## 2020-10-21 NOTE — Therapy (Signed)
[name] [address] [place], [place], [address] Phone: [phone]   Fax:  [phone]  Physical Therapy Treatment  Patient Details  Name: Travis Rose MRN: [id number] Date of Birth: [birth year] Referring Provider (PT): Teresa Coombs   Encounter Date: 10/20/2020   PT End of Session - 10/21/20 0917     Visit Number 4    Number of Visits 16    Date for PT Re-Evaluation 11/27/20    Authorization Type UHC    PT Start Time 1216    PT Stop Time 1300    PT Time Calculation (min) 44 min    Activity Tolerance Patient tolerated treatment well    Behavior During Therapy [facility] for tasks assessed/performed             Past Medical History:  Diagnosis Date   Allergy     Past Surgical History:  Procedure Laterality Date   NO PAST SURGERIES      There were no vitals filed for this visit.   Subjective Assessment - 10/21/20 0916     Subjective Pt had MD f/u last week, good report. He is concerned for pain in knee, at joint line. States pain with sitting in chair, with knee bent 90 deg, which is new since having procedure.    Patient Stated Goals decreased pain, return to activity    Currently in Pain? Yes    Pain Score 3     Pain Location Knee    Pain Orientation Left    Pain Descriptors / Indicators Aching    Pain Type Acute pain    Pain Onset More than a month ago    Pain Frequency Intermittent                               [facility] Adult PT Treatment/Exercise - 10/21/20 0001       Knee/Hip Exercises: Stretches   Active Hamstring Stretch 2 reps;30 seconds;Left;Right    Quad Stretch Limitations manual      Knee/Hip Exercises: Aerobic   Recumbent Bike L2 x 8 min      Knee/Hip Exercises: Standing   Forward Step Up 10 reps;Both;Hand Hold: 0;Step Height: 6"    Functional Squat 20 reps    Stairs up/down 5 steps, 6 in, no HR, recipricol.    Other Standing Knee Exercises walk/march fwd/bwd 10 ft x 4  ea;      Knee/Hip Exercises: Seated   Sit to Sand 10 reps      Knee/Hip Exercises: Supine   Bridges 20 reps    Straight Leg Raises 15 reps;Both      Manual Therapy   Manual Therapy Soft tissue mobilization    Soft tissue mobilization STM/IASTM to L quad, central and lateral , avoiding distal/central quad near incision.    Passive ROM for L knee flex/ext , manual(light)  quad stretch in prone                       PT Short Term Goals - 10/07/20 2157       PT SHORT TERM GOAL #1   Title Patient will be independent with initial HEP    Time 2    Period Weeks    Status New    Target Date 10/16/20               PT Long Term Goals - 10/07/20 2158  PT LONG TERM GOAL #1   Title Patient will be independent with final  HEP    Time 8    Period Weeks    Status New    Target Date 11/27/20      PT LONG TERM GOAL #2   Title Pt to report decreased pain in L knee to 0-2/10 with standing, walking, and exercise for at least 30 min    Time 8    Period Weeks    Status New    Target Date 11/27/20      PT LONG TERM GOAL #3   Title Pt to demo L quad length to be WNL/equal to R.    Time 6    Period Weeks    Status New    Target Date 11/13/20      PT LONG TERM GOAL #4   Title Pt to demo full functional strength of L quad, with squats, stairs, and single leg activity, to improve ability for return to exercise, community, and functional activites without pain and to reduce risk for re-injury    Time 8    Period Weeks    Status New    Target Date 11/27/20                   Plan - 10/21/20 0919     Clinical Impression Statement Pt with improving knee ROM and imroving quad length, near normal. He has been able to progress strengthening without increased pain. Did well with progressions today , for squat and stairs. Discussed resuming recipricol pattern on stairs, as long as its not painful. Incision fully healed at this time, discussed pool activities to  perform at this time. Also discussed expected time frame for healing , pt concerned for pain that he is still having in knee. Pt progressing well per protocol.    Personal Factors and Comorbidities Time since onset of injury/illness/exacerbation;Past/Current Experience    Examination-Activity Limitations Bend;Squat;Stairs;Carry;Stand;Lift;Locomotion Level    Examination-Participation Restrictions Cleaning;Community Activity;Shop;Driving;Yard Work;Laundry;Meal Prep;Occupation    Stability/Clinical Decision Making Stable/Uncomplicated    Rehab Potential Good    PT Frequency 2x / week    PT Duration 6 weeks    PT Treatment/Interventions ADLs/Self Care Home Management;Cryotherapy;Electrical Stimulation;Iontophoresis 4mg /ml Dexamethasone;Moist Heat;Traction;Balance training;Therapeutic exercise;Therapeutic activities;Functional mobility training;Stair training;Gait training;DME Instruction;Parrafin;Ultrasound;Neuromuscular re-education;Patient/family education;Orthotic Fit/Training;Vasopneumatic Device;Taping;Dry needling;Passive range of motion;Joint Manipulations;Spinal Manipulations;Manual techniques    PT Home Exercise Plan [place] and Agree with Plan of Care Patient             Patient will benefit from skilled therapeutic intervention in order to improve the following deficits and impairments:  Abnormal gait, Hypomobility, Decreased activity tolerance, Decreased strength, Pain, Increased fascial restricitons, Decreased balance, Decreased mobility, Increased muscle spasms, Decreased range of motion, Impaired flexibility  Visit Diagnosis: Acute pain of left knee     Problem List Patient Active Problem List   Diagnosis Date Noted   Morton's neuroma of right foot 03/17/2020   Chondral defect of left patella 01/30/2020   Patellofemoral pain syndrome of left knee 12/06/2019   Quadriceps tendinitis 07/25/2019   Gynecomastia 02/16/2019   Left shoulder pain 07/12/2018    Labral tear of shoulder, right, initial encounter 10/31/2534   Oral lichen planus 64/40/3474   GAD (generalized anxiety disorder) 04/10/2018   Seasonal allergic rhinitis 11/24/2017   Allergic conjunctivitis 11/24/2017   Mouth lesion 11/24/2017   Eczema 10/28/2017   Right arm pain 04/25/2017  Lyndee Hensen, PT, DPT 9:21 AM  10/21/20  [facility] [address] [employer] [place], [place], [address] Phone: [phone]   Fax:  [phone]  Name: Princeston Blizzard MRN: [id number] Date of Birth: 1970/04/14

## 2020-10-27 ENCOUNTER — Encounter: Payer: Self-pay | Admitting: Physical Therapy

## 2020-10-27 ENCOUNTER — Other Ambulatory Visit: Payer: Self-pay

## 2020-10-27 ENCOUNTER — Ambulatory Visit (INDEPENDENT_AMBULATORY_CARE_PROVIDER_SITE_OTHER): Payer: 59 | Admitting: Physical Therapy

## 2020-10-27 DIAGNOSIS — M25562 Pain in left knee: Secondary | ICD-10-CM | POA: Diagnosis not present

## 2020-10-28 ENCOUNTER — Encounter: Payer: Self-pay | Admitting: Physical Therapy

## 2020-10-28 NOTE — Therapy (Signed)
[facility] [address] [place], [place], [address] Phone: [phone]   Fax:  [phone]  Physical Therapy Treatment  Patient Details  Name: Travis Rose MRN: [id number] Date of Birth: 1970-10-07 Referring Provider (PT): Teresa Coombs   Encounter Date: 10/27/2020   PT End of Session - 10/28/20 2118     Visit Number 5    Number of Visits 16    Date for PT Re-Evaluation 11/27/20    Authorization Type UHC    PT Start Time 1215    PT Stop Time 1300    PT Time Calculation (min) 45 min    Activity Tolerance Patient tolerated treatment well    Behavior During Therapy [facility] for tasks assessed/performed             Past Medical History:  Diagnosis Date   Allergy     Past Surgical History:  Procedure Laterality Date   NO PAST SURGERIES      There were no vitals filed for this visit.   Subjective Assessment - 10/28/20 2117     Subjective Pt states continued soreness when sitting with knee at 90 deg at work or for meals.  Also has pain with going down stairs and squats in anterior knee.    Currently in Pain? Yes    Pain Score 3     Pain Location Knee    Pain Orientation Left    Pain Descriptors / Indicators Aching    Pain Type Acute pain    Pain Onset More than a month ago    Pain Frequency Intermittent                               [facility] Adult PT Treatment/Exercise - 10/28/20 0001       Knee/Hip Exercises: Stretches   Active Hamstring Stretch 2 reps;30 seconds;Left;Right    Quad Stretch Limitations manual    ITB Stretch 3 reps;30 seconds    ITB Stretch Limitations supine with strap.      Knee/Hip Exercises: Aerobic   Recumbent Bike L2 x 8 min      Knee/Hip Exercises: Standing   Forward Step Up 10 reps;Both;Hand Hold: 0;Step Height: 6"    Step Down Limitations pain    Functional Squat 20 reps    Functional Squat Limitations 25lb    Stairs up/down 5 steps, 6 in, no HR, recipricol.     Other Standing Knee Exercises walk/march fwd/bwd 10 ft x 4 ea slow;   Dead lift, partial range 45 lb x 20;      Knee/Hip Exercises: Seated   Sit to Sand 10 reps   pain with attempts for 1 leg eccentric lowering.     Knee/Hip Exercises: Supine   Straight Leg Raises 15 reps;Both      Manual Therapy   Manual Therapy Soft tissue mobilization    Soft tissue mobilization STM/ TPR/ /IASTM to L quad, central and lateral ,    Passive ROM for L knee flex/ext , manual(light)  quad stretch in prone                       PT Short Term Goals - 10/07/20 2157       PT SHORT TERM GOAL #1   Title Patient will be independent with initial HEP    Time 2    Period Weeks    Status New    Target  Date 10/16/20               PT Long Term Goals - 10/07/20 2158       PT LONG TERM GOAL #1   Title Patient will be independent with final  HEP    Time 8    Period Weeks    Status New    Target Date 11/27/20      PT LONG TERM GOAL #2   Title Pt to report decreased pain in L knee to 0-2/10 with standing, walking, and exercise for at least 30 min    Time 8    Period Weeks    Status New    Target Date 11/27/20      PT LONG TERM GOAL #3   Title Pt to demo L quad length to be WNL/equal to R.    Time 6    Period Weeks    Status New    Target Date 11/13/20      PT LONG TERM GOAL #4   Title Pt to demo full functional strength of L quad, with squats, stairs, and single leg activity, to improve ability for return to exercise, community, and functional activites without pain and to reduce risk for re-injury    Time 8    Period Weeks    Status New    Target Date 11/27/20                   Plan - 10/28/20 2119     Clinical Impression Statement Pt doing very well functionally. He has near full ROM for knee and quad. He has been able to progress strength in last couple sessions. He does have soreness today with SL step down and eccetric lowering. He does have mild muscle  tension in lateral quad that we will continue to work on. He is somewhat frustrated with continued pain, and pain when sitting with leg bent at 90 deg for only about 15 min. Pt to benefit from continued strengthening and control for quad complex.    Personal Factors and Comorbidities Time since onset of injury/illness/exacerbation;Past/Current Experience    Examination-Activity Limitations Bend;Squat;Stairs;Carry;Stand;Lift;Locomotion Level    Examination-Participation Restrictions Cleaning;Community Activity;Shop;Driving;Yard Work;Laundry;Meal Prep;Occupation    Stability/Clinical Decision Making Stable/Uncomplicated    Rehab Potential Good    PT Frequency 2x / week    PT Duration 6 weeks    PT Treatment/Interventions ADLs/Self Care Home Management;Cryotherapy;Electrical Stimulation;Iontophoresis 4mg /ml Dexamethasone;Moist Heat;Traction;Balance training;Therapeutic exercise;Therapeutic activities;Functional mobility training;Stair training;Gait training;DME Instruction;Parrafin;Ultrasound;Neuromuscular re-education;Patient/family education;Orthotic Fit/Training;Vasopneumatic Device;Taping;Dry needling;Passive range of motion;Joint Manipulations;Spinal Manipulations;Manual techniques    PT Home Exercise Plan [place] and Agree with Plan of Care Patient             Patient will benefit from skilled therapeutic intervention in order to improve the following deficits and impairments:  Abnormal gait, Hypomobility, Decreased activity tolerance, Decreased strength, Pain, Increased fascial restricitons, Decreased balance, Decreased mobility, Increased muscle spasms, Decreased range of motion, Impaired flexibility  Visit Diagnosis: Acute pain of left knee     Problem List Patient Active Problem List   Diagnosis Date Noted   Morton's neuroma of right foot 03/17/2020   Chondral defect of left patella 01/30/2020   Patellofemoral pain syndrome of left knee 12/06/2019   Quadriceps  tendinitis 07/25/2019   Gynecomastia 02/16/2019   Left shoulder pain 07/12/2018   Labral tear of shoulder, right, initial encounter 33/82/5053   Oral lichen planus 97/67/3419   GAD (generalized anxiety disorder) 04/10/2018  Seasonal allergic rhinitis 11/24/2017   Allergic conjunctivitis 11/24/2017   Mouth lesion 11/24/2017   Eczema 10/28/2017   Right arm pain 04/25/2017    Lyndee Hensen, PT, DPT 9:27 PM  10/28/20    [facility] [place], [place], [address] Phone: [phone]   Fax:  [phone]  Name: Travis Rose MRN: [id number] Date of Birth: 1970/07/18

## 2020-11-03 ENCOUNTER — Other Ambulatory Visit: Payer: Self-pay

## 2020-11-04 ENCOUNTER — Ambulatory Visit (INDEPENDENT_AMBULATORY_CARE_PROVIDER_SITE_OTHER): Payer: 59 | Admitting: Family Medicine

## 2020-11-04 ENCOUNTER — Encounter: Payer: Self-pay | Admitting: Family Medicine

## 2020-11-04 VITALS — BP 110/72 | HR 79 | Temp 98.0°F | Ht 68.0 in | Wt 169.6 lb

## 2020-11-04 DIAGNOSIS — Z1211 Encounter for screening for malignant neoplasm of colon: Secondary | ICD-10-CM

## 2020-11-04 DIAGNOSIS — Z125 Encounter for screening for malignant neoplasm of prostate: Secondary | ICD-10-CM

## 2020-11-04 DIAGNOSIS — F411 Generalized anxiety disorder: Secondary | ICD-10-CM | POA: Diagnosis not present

## 2020-11-04 DIAGNOSIS — Z0000 Encounter for general adult medical examination without abnormal findings: Secondary | ICD-10-CM | POA: Diagnosis not present

## 2020-11-04 DIAGNOSIS — N62 Hypertrophy of breast: Secondary | ICD-10-CM

## 2020-11-04 MED ORDER — ALPRAZOLAM 0.5 MG PO TABS: 0.5000 mg | ORAL_TABLET | Freq: Every day | ORAL | 2 refills | Status: DC | PRN

## 2020-11-04 NOTE — Patient Instructions (Addendum)
Give [place] 2-3 business days to get the results of your labs back.   Keep the diet clean and stay active.  I recommend getting the updated bivalent covid vaccination booster at your convenience.   The new Shingrix vaccine (for shingles) is a 2 shot series. It can make people feel low energy, achy and almost like they have the flu for 48 hours after injection. Please plan accordingly when deciding on when to get this shot. Call our office for a nurse visit appointment to get this. The second shot of the series is less severe regarding the side effects, but it still lasts 48 hours.   If you do not hear anything about your referral in the next 1-2 weeks, call our office and ask for an update.  Let us know if you need anything.

## 2020-11-04 NOTE — Progress Notes (Signed)
Chief Complaint  Patient presents with   Annual Exam    Refill Alprazolam    Well Male Travis Rose is here for a complete physical.   His last physical was >1 year ago.  Current diet: in general, a "healthy" diet.  Current exercise: cardio, lifting weights Weight trend: stable Fatigue out of ordinary? No. Seat belt? Yes.    Health maintenance Shingrix- No Colonoscopy- No Tetanus- Yes HIV- Yes Hep C- Yes   Past Medical History:  Diagnosis Date   Allergy     Past Surgical History:  Procedure Laterality Date   NO PAST SURGERIES      Medications  Current Outpatient Medications on File Prior to Visit  Medication Sig Dispense Refill   nitroGLYCERIN (NITRODUR - DOSED IN MG/24 HR) 0.2 mg/hr patch Cut and apply 1/4 patch to most painful area q24h. 30 patch 11   PENNSAID 2 % SOLN APPLY 1 PUMP (1 GRAM) TO AFFECTED AREA TOPICALLY TWICE DAILY AS DIRECTED 112 g 2   Allergies No Known Allergies  Family History Family History  Problem Relation Age of Onset   Allergic rhinitis Mother    Allergic rhinitis Brother    Asthma Brother    Angioedema Neg Hx    Eczema Neg Hx    Immunodeficiency Neg Hx    Urticaria Neg Hx    Other Neg Hx        gynecomastia    Review of Systems: Constitutional:  no fevers Eye:  no recent significant change in vision Ear/Nose/Mouth/Throat:  Ears:  no hearing loss Nose/Mouth/Throat:  no complaints of nasal congestion, no sore throat Cardiovascular:  no chest pain Respiratory:  no shortness of breath Gastrointestinal:  no change in bowel habits GU:  Male: negative for dysuria, frequency Musculoskeletal/Extremities:  no new joint pain Integumentary (Skin/Breast):  no abnormal skin lesions reported Neurologic:  no headaches Endocrine: No unexpected weight changes Hematologic/Lymphatic:  no abnormal bleeding  Exam BP 110/72   Pulse 79   Temp 98 F (36.7 C) (Oral)   Ht 5\' 8"  (1.727 m)   Wt 169 lb 9 oz (76.9 kg)   SpO2 98%   BMI 25.78  kg/m  General:  well developed, well nourished, in no apparent distress Skin:  no significant moles, warts, or growths Head:  no masses, lesions, or tenderness Eyes:  pupils equal and round, sclera anicteric without injection Ears:  canals without lesions, TMs shiny without retraction, no obvious effusion, no erythema Nose:  nares patent, septum midline, mucosa normal Throat/Pharynx:  lips and gingiva without lesion; tongue and uvula midline; non-inflamed pharynx; no exudates or postnasal drainage Neck: neck supple without adenopathy, thyromegaly, or masses Cardiac: RRR, no bruits, no LE edema Lungs:  clear to auscultation, breath sounds equal bilaterally, no respiratory distress Abdomen: BS+, soft, non-tender, non-distended, no masses or organomegaly noted Rectal: Deferred Musculoskeletal:  symmetrical muscle groups noted without atrophy or deformity Neuro:  gait normal; deep tendon reflexes normal and symmetric Psych: well oriented with normal range of affect and appropriate judgment/insight  Assessment and Plan  Well adult exam - Plan: CBC, Comprehensive metabolic panel, Lipid panel  Screening for colon cancer - Plan: Ambulatory referral to Gastroenterology  Screening for prostate cancer - Plan: PSA  GAD (generalized anxiety disorder) - Plan: ALPRAZolam (XANAX) 0.5 MG tablet   Well 50 y.o. male. Counseled on diet and exercise. Counseled on risks and benefits of prostate cancer screening with PSA. The patient agrees to undergo testing. Politely declines covid vaccination. Shingrix rec'd.  [facility]- refer GI.  Immunizations, labs, and further orders as above. Follow up in 6 mo or prn. The patient voiced understanding and agreement to the plan.  [place], DO 11/04/20 1:20 PM

## 2020-11-05 ENCOUNTER — Other Ambulatory Visit: Payer: Self-pay

## 2020-11-05 ENCOUNTER — Other Ambulatory Visit (INDEPENDENT_AMBULATORY_CARE_PROVIDER_SITE_OTHER): Payer: 59

## 2020-11-05 DIAGNOSIS — Z125 Encounter for screening for malignant neoplasm of prostate: Secondary | ICD-10-CM | POA: Diagnosis not present

## 2020-11-05 DIAGNOSIS — Z0000 Encounter for general adult medical examination without abnormal findings: Secondary | ICD-10-CM

## 2020-11-05 LAB — LIPID PANEL
Cholesterol: 157 mg/dL (ref 0–200)
HDL: 68 mg/dL (ref >39.00)
LDL Cholesterol: 73 mg/dL (ref 0–99)
NonHDL: 89.25
Total CHOL/HDL Ratio: 2
Triglycerides: 79 mg/dL (ref 0.0–149.0)
VLDL: 15.8 mg/dL (ref 0.0–40.0)

## 2020-11-05 LAB — CBC
HCT: 43.8 % (ref 39.0–52.0)
Hemoglobin: 14.8 g/dL (ref 13.0–17.0)
MCHC: 33.7 g/dL (ref 30.0–36.0)
MCV: 93.6 fl (ref 78.0–100.0)
Platelets: 170 10*3/uL (ref 150.0–400.0)
RBC: 4.67 Mil/uL (ref 4.22–5.81)
RDW: 13.7 % (ref 11.5–15.5)
WBC: 3.6 10*3/uL — ABNORMAL LOW (ref 4.0–10.5)

## 2020-11-05 LAB — COMPREHENSIVE METABOLIC PANEL
ALT: 20 U/L (ref 0–53)
AST: 19 U/L (ref 0–37)
Albumin: 4.5 g/dL (ref 3.5–5.2)
Alkaline Phosphatase: 44 U/L (ref 39–117)
BUN: 22 mg/dL (ref 6–23)
CO2: 29 mEq/L (ref 19–32)
Calcium: 9.3 mg/dL (ref 8.4–10.5)
Chloride: 104 mEq/L (ref 96–112)
Creatinine, Ser: 1.28 mg/dL (ref 0.40–1.50)
GFR: 65.09 mL/min (ref >60.00)
Glucose, Bld: 81 mg/dL (ref 70–99)
Potassium: 3.8 mEq/L (ref 3.5–5.1)
Sodium: 140 mEq/L (ref 135–145)
Total Bilirubin: 0.9 mg/dL (ref 0.2–1.2)
Total Protein: 6.3 g/dL (ref 6.0–8.3)

## 2020-11-05 LAB — PSA: PSA: 0.52 ng/mL (ref 0.10–4.00)

## 2020-11-06 ENCOUNTER — Encounter: Payer: Self-pay | Admitting: Physical Therapy

## 2020-11-06 ENCOUNTER — Ambulatory Visit (INDEPENDENT_AMBULATORY_CARE_PROVIDER_SITE_OTHER): Payer: 59 | Admitting: Physical Therapy

## 2020-11-06 DIAGNOSIS — M25562 Pain in left knee: Secondary | ICD-10-CM | POA: Diagnosis not present

## 2020-11-06 NOTE — Therapy (Signed)
[facility] [address] [place], [place], [address] Phone: [phone]   Fax:  [phone]  Physical Therapy Treatment  Patient Details  Name: Travis Rose MRN: [id number] Date of Birth: 1970-08-10 Referring Provider (PT): Teresa Coombs   Encounter Date: 11/06/2020   PT End of Session - 11/06/20 0857     Visit Number 6    Number of Visits 16    Date for PT Re-Evaluation 11/27/20    Authorization Type UHC    PT Start Time 0806    PT Stop Time 0846    PT Time Calculation (min) 40 min    Activity Tolerance Patient tolerated treatment well    Behavior During Therapy [facility] for tasks assessed/performed             Past Medical History:  Diagnosis Date   Allergy     Past Surgical History:  Procedure Laterality Date   NO PAST SURGERIES      There were no vitals filed for this visit.   Subjective Assessment - 11/06/20 0856     Subjective Pt states continued pain in knee. He feels he is having more soreness at rest than he was previously. States soreness with sitting, and with sleeping. He has been doing HEP.    Currently in Pain? Yes    Pain Score 3     Pain Location Knee    Pain Orientation Left    Pain Descriptors / Indicators Aching    Pain Type Acute pain    Pain Onset More than a month ago    Pain Frequency Intermittent                               [facility] Adult PT Treatment/Exercise - 11/06/20 0001       Knee/Hip Exercises: Stretches   Active Hamstring Stretch --    Quad Stretch Limitations manual    ITB Stretch --    ITB Stretch Limitations --      Knee/Hip Exercises: Aerobic   Recumbent Bike L2 x 8 min      Knee/Hip Exercises: Standing   Forward Step Up 10 reps;Both;Hand Hold: 0;Step Height: 6"    Forward Step Up Limitations on BOSU    Step Down 10 reps;Left;Hand Hold: 0;Step Height: 4"    Functional Squat 20 reps    Functional Squat Limitations 25lb    Stairs --    Other  Standing Knee Exercises SL RDL 10 lb x 2x10 on L;  SL diagonal reach to chair x 10 bi;    Other Standing Knee Exercises mini squat BOSU x 15;      Knee/Hip Exercises: Seated   Long Arc Quad 20 reps    Long Arc Quad Weight 5 lbs.    Long Arc Quad Limitations slow eccentric lowering    Sit to [employer] 15 reps   SL eccentric lowering, higher mat table.     Knee/Hip Exercises: Supine   Straight Leg Raises --      Manual Therapy   Manual Therapy Soft tissue mobilization    Soft tissue mobilization --    Passive ROM --                     PT Education - 11/06/20 0857     Education Details Reviewed HEP    Person(s) Educated Patient    Methods Explanation;Demonstration;Tactile cues;Verbal cues;Handout    Comprehension Verbalized  understanding;Tactile cues required;Returned demonstration;Verbal cues required;Need further instruction              PT Short Term Goals - 11/06/20 0857       PT SHORT TERM GOAL #1   Title Patient will be independent with initial HEP    Time 2    Period Weeks    Status Achieved    Target Date 10/16/20               PT Long Term Goals - 10/07/20 2158       PT LONG TERM GOAL #1   Title Patient will be independent with final  HEP    Time 8    Period Weeks    Status New    Target Date 11/27/20      PT LONG TERM GOAL #2   Title Pt to report decreased pain in L knee to 0-2/10 with standing, walking, and exercise for at least 30 min    Time 8    Period Weeks    Status New    Target Date 11/27/20      PT LONG TERM GOAL #3   Title Pt to demo L quad length to be WNL/equal to R.    Time 6    Period Weeks    Status New    Target Date 11/13/20      PT LONG TERM GOAL #4   Title Pt to demo full functional strength of L quad, with squats, stairs, and single leg activity, to improve ability for return to exercise, community, and functional activites without pain and to reduce risk for re-injury    Time 8    Period Weeks    Status  New    Target Date 11/27/20                   Plan - 11/06/20 0859     Clinical Impression Statement Pt now with full ROM of knee joint, and full quad ROM. He is doing well with strengthening, but certainly has strength and stability deficits seen with activities today. He will benefit from continued work on strength and stability for L LE. He is frustrated with continued pain. He has improved ability for eccentric lowering and step downs today with less pain than previously. I do not think he is over doing activity, but notes increased pain with sititng and sleeping in the last week. We discussed continuing to increase walking time (without pain) prior to starting running, as well as continuing to improve strength and stability for optimal outcome.    Personal Factors and Comorbidities Time since onset of injury/illness/exacerbation;Past/Current Experience    Examination-Activity Limitations Bend;Squat;Stairs;Carry;Stand;Lift;Locomotion Level    Examination-Participation Restrictions Cleaning;Community Activity;Shop;Driving;Yard Work;Laundry;Meal Prep;Occupation    Stability/Clinical Decision Making Stable/Uncomplicated    Rehab Potential Good    PT Frequency 2x / week    PT Duration 6 weeks    PT Treatment/Interventions ADLs/Self Care Home Management;Cryotherapy;Electrical Stimulation;Iontophoresis 4mg /ml Dexamethasone;Moist Heat;Traction;Balance training;Therapeutic exercise;Therapeutic activities;Functional mobility training;Stair training;Gait training;DME Instruction;Parrafin;Ultrasound;Neuromuscular re-education;Patient/family education;Orthotic Fit/Training;Vasopneumatic Device;Taping;Dry needling;Passive range of motion;Joint Manipulations;Spinal Manipulations;Manual techniques    PT Home Exercise Plan [place] and Agree with Plan of Care Patient             Patient will benefit from skilled therapeutic intervention in order to improve the following deficits  and impairments:  Abnormal gait, Hypomobility, Decreased activity tolerance, Decreased strength, Pain, Increased fascial restricitons, Decreased balance, Decreased mobility, Increased muscle spasms, Decreased  range of motion, Impaired flexibility  Visit Diagnosis: Acute pain of left knee     Problem List Patient Active Problem List   Diagnosis Date Noted   Morton's neuroma of right foot 03/17/2020   Chondral defect of left patella 01/30/2020   Patellofemoral pain syndrome of left knee 12/06/2019   Quadriceps tendinitis 07/25/2019   Gynecomastia 02/16/2019   Left shoulder pain 07/12/2018   Labral tear of shoulder, right, initial encounter 95/63/8756   Oral lichen planus 43/32/9518   GAD (generalized anxiety disorder) 04/10/2018   Seasonal allergic rhinitis 11/24/2017   Allergic conjunctivitis 11/24/2017   Mouth lesion 11/24/2017   Eczema 10/28/2017   Right arm pain 04/25/2017    Lyndee Hensen, PT, DPT 9:05 AM  11/06/20    [facility] [place], [place], [address] Phone: [phone]   Fax:  [phone]  Name: Travis Rose MRN: [id number] Date of Birth: 01/08/1970

## 2020-11-07 ENCOUNTER — Other Ambulatory Visit: Payer: Self-pay

## 2020-11-07 ENCOUNTER — Ambulatory Visit (INDEPENDENT_AMBULATORY_CARE_PROVIDER_SITE_OTHER): Payer: 59 | Admitting: Physical Therapy

## 2020-11-07 ENCOUNTER — Encounter: Payer: Self-pay | Admitting: Physical Therapy

## 2020-11-07 DIAGNOSIS — M25562 Pain in left knee: Secondary | ICD-10-CM

## 2020-11-07 NOTE — Therapy (Signed)
[facility] [address] [place], [place], [address] Phone: [phone]   Fax:  [phone]  Physical Therapy Treatment  Patient Details  Name: Travis Rose MRN: [id number] Date of Birth: 1970-11-17 Referring Provider (PT): Teresa Coombs   Encounter Date: 11/07/2020   PT End of Session - 11/07/20 1333     Visit Number 7    Number of Visits 16    Date for PT Re-Evaluation 11/27/20    Authorization Type UHC    PT Start Time 0932    PT Stop Time 1015    PT Time Calculation (min) 43 min    Activity Tolerance Patient tolerated treatment well    Behavior During Therapy [facility] for tasks assessed/performed             Past Medical History:  Diagnosis Date   Allergy     Past Surgical History:  Procedure Laterality Date   NO PAST SURGERIES      There were no vitals filed for this visit.   Subjective Assessment - 11/07/20 1332     Subjective Pt states mild soreness from yesterdays session/exercises.    Patient Stated Goals decreased pain, return to activity    Currently in Pain? Yes    Pain Score 3     Pain Location Knee    Pain Orientation Left    Pain Descriptors / Indicators Aching    Pain Type Acute pain    Pain Onset More than a month ago    Pain Frequency Intermittent                               [facility] Adult PT Treatment/Exercise - 11/07/20 0001       Knee/Hip Exercises: Aerobic   Recumbent Bike L2 x 8 min      Knee/Hip Exercises: Standing   Forward Step Up 10 reps;Both;Hand Hold: 0;Step Height: 6"    Forward Step Up Limitations 12 in step    Step Down --    Other Standing Knee Exercises Weight shifts on flat side of BOSU L/R and A/P x 20 ea;    Other Standing Knee Exercises mini squat BOSU x 15;      Knee/Hip Exercises: Seated   Long Arc Quad 20 reps    Long Arc Quad Weight 5 lbs.    Long [employer] Limitations slow eccentric lowering    Sit to [employer] --      Knee/Hip Exercises:  Supine   Quad Sets 15 reps      Manual Therapy   Manual Therapy Soft tissue mobilization    Manual therapy comments skilled palpation and monitoring of soft tissue with dry needling.    Soft tissue mobilization dTM/ TPR/ /IASTM to L quad, central and lateral ,              Trigger Point Dry Needling - 11/07/20 0001     Consent Given? Yes    Education Handout Provided Yes    Muscles Treated Lower Quadrant Rectus femoris;Vastus lateralis    Rectus femoris Response Palpable increased muscle length   L   Vastus lateralis Response Twitch response elicited;Palpable increased muscle length   L                    PT Short Term Goals - 11/06/20 0857       PT SHORT TERM GOAL #1   Title Patient will be  independent with initial HEP    Time 2    Period Weeks    Status Achieved    Target Date 10/16/20               PT Long Term Goals - 10/07/20 2158       PT LONG TERM GOAL #1   Title Patient will be independent with final  HEP    Time 8    Period Weeks    Status New    Target Date 11/27/20      PT LONG TERM GOAL #2   Title Pt to report decreased pain in L knee to 0-2/10 with standing, walking, and exercise for at least 30 min    Time 8    Period Weeks    Status New    Target Date 11/27/20      PT LONG TERM GOAL #3   Title Pt to demo L quad length to be WNL/equal to R.    Time 6    Period Weeks    Status New    Target Date 11/13/20      PT LONG TERM GOAL #4   Title Pt to demo full functional strength of L quad, with squats, stairs, and single leg activity, to improve ability for return to exercise, community, and functional activites without pain and to reduce risk for re-injury    Time 8    Period Weeks    Status New    Target Date 11/27/20                   Plan - 11/07/20 1335     Clinical Impression Statement Focus on  manual for quad tightness with manual tissue release, IASTM, and dry needling, in attempts to decreased pain in  lateral musculature. Pt challenged with strengthening and stability with BOSU today. Plan to continue to progress strength and stability as tolerated.    Personal Factors and Comorbidities Time since onset of injury/illness/exacerbation;Past/Current Experience    Examination-Activity Limitations Bend;Squat;Stairs;Carry;Stand;Lift;Locomotion Level    Examination-Participation Restrictions Cleaning;Community Activity;Shop;Driving;Yard Work;Laundry;Meal Prep;Occupation    Stability/Clinical Decision Making Stable/Uncomplicated    Rehab Potential Good    PT Frequency 2x / week    PT Duration 6 weeks    PT Treatment/Interventions ADLs/Self Care Home Management;Cryotherapy;Electrical Stimulation;Iontophoresis 4mg /ml Dexamethasone;Moist Heat;Traction;Balance training;Therapeutic exercise;Therapeutic activities;Functional mobility training;Stair training;Gait training;DME Instruction;Parrafin;Ultrasound;Neuromuscular re-education;Patient/family education;Orthotic Fit/Training;Vasopneumatic Device;Taping;Dry needling;Passive range of motion;Joint Manipulations;Spinal Manipulations;Manual techniques    PT Home Exercise Plan [place] and Agree with Plan of Care Patient             Patient will benefit from skilled therapeutic intervention in order to improve the following deficits and impairments:  Abnormal gait, Hypomobility, Decreased activity tolerance, Decreased strength, Pain, Increased fascial restricitons, Decreased balance, Decreased mobility, Increased muscle spasms, Decreased range of motion, Impaired flexibility  Visit Diagnosis: Acute pain of left knee     Problem List Patient Active Problem List   Diagnosis Date Noted   Morton's neuroma of right foot 03/17/2020   Chondral defect of left patella 01/30/2020   Patellofemoral pain syndrome of left knee 12/06/2019   Quadriceps tendinitis 07/25/2019   Gynecomastia 02/16/2019   Left shoulder pain 07/12/2018   Labral tear of  shoulder, right, initial encounter 24/58/0998   Oral lichen planus 33/82/5053   GAD (generalized anxiety disorder) 04/10/2018   Seasonal allergic rhinitis 11/24/2017   Allergic conjunctivitis 11/24/2017   Mouth lesion 11/24/2017   Eczema 10/28/2017  Right arm pain 04/25/2017   Lyndee Hensen, PT, DPT 1:39 PM  11/07/20    [facility] [place], [place], [address] Phone: [phone]   Fax:  [phone]  Name: Travis Rose MRN: [id number] Date of Birth: 12-16-1970

## 2020-11-12 ENCOUNTER — Other Ambulatory Visit: Payer: Self-pay

## 2020-11-12 ENCOUNTER — Ambulatory Visit (INDEPENDENT_AMBULATORY_CARE_PROVIDER_SITE_OTHER): Payer: 59 | Admitting: Physical Therapy

## 2020-11-12 ENCOUNTER — Encounter: Payer: Self-pay | Admitting: Physical Therapy

## 2020-11-12 DIAGNOSIS — M25562 Pain in left knee: Secondary | ICD-10-CM

## 2020-11-12 NOTE — Therapy (Signed)
[facility] [address] [place], [place], [address] Phone: [phone]   Fax:  [phone]  Physical Therapy Treatment  Patient Details  Name: Travis Rose MRN: [id number] Date of Birth: January 19, 1970 Referring Provider (PT): Teresa Coombs   Encounter Date: 11/12/2020   PT End of Session - 11/12/20 2124     Visit Number 8    Number of Visits 16    Date for PT Re-Evaluation 11/27/20    Authorization Type UHC    PT Start Time 7106    PT Stop Time 1428    PT Time Calculation (min) 43 min    Activity Tolerance Patient tolerated treatment well    Behavior During Therapy [facility] for tasks assessed/performed             Past Medical History:  Diagnosis Date   Allergy     Past Surgical History:  Procedure Laterality Date   NO PAST SURGERIES      There were no vitals filed for this visit.   Subjective Assessment - 11/12/20 2120     Subjective Pt states frustration with continued pain, feels he has more pain than prior to procedure. States pain at rest when sitting and also pain with sleeping at times.    Patient Stated Goals decreased pain, return to activity    Currently in Pain? Yes    Pain Score 3     Pain Location Knee    Pain Orientation Left    Pain Descriptors / Indicators Aching    Pain Type Acute pain    Pain Onset More than a month ago    Pain Frequency Intermittent                               [facility] Adult PT Treatment/Exercise - 11/12/20 0001       Knee/Hip Exercises: Stretches   Sports administrator 3 reps;30 seconds    Quad Stretch Limitations prone w strap      Knee/Hip Exercises: Aerobic   Recumbent Bike L2 x 8 min      Knee/Hip Exercises: Standing   Functional Squat 20 reps    SLS with Vectors SLS with UE T-band rotation GTB x 15 bil;    Other Standing Knee Exercises Weight shifts on flat side of BOSU L/R and A/P x 20 ea;    Other Standing Knee Exercises SL RDL 10lb x 15 bil; March  with Unilateral hold 10lb , 25 ft x 4;      Manual Therapy   Soft tissue mobilization dTM/ TPR/ /IASTM to L quad, central and lateral ,                       PT Short Term Goals - 11/06/20 0857       PT SHORT TERM GOAL #1   Title Patient will be independent with initial HEP    Time 2    Period Weeks    Status Achieved    Target Date 10/16/20               PT Long Term Goals - 10/07/20 2158       PT LONG TERM GOAL #1   Title Patient will be independent with final  HEP    Time 8    Period Weeks    Status New    Target Date 11/27/20      PT LONG TERM  GOAL #2   Title Pt to report decreased pain in L knee to 0-2/10 with standing, walking, and exercise for at least 30 min    Time 8    Period Weeks    Status New    Target Date 11/27/20      PT LONG TERM GOAL #3   Title Pt to demo L quad length to be WNL/equal to R.    Time 6    Period Weeks    Status New    Target Date 11/13/20      PT LONG TERM GOAL #4   Title Pt to demo full functional strength of L quad, with squats, stairs, and single leg activity, to improve ability for return to exercise, community, and functional activites without pain and to reduce risk for re-injury    Time 8    Period Weeks    Status New    Target Date 11/27/20                   Plan - 11/12/20 2126     Clinical Impression Statement Pt with good ability for strengthening. He is improving, but does have mild strenth and stability deficits from L vs R. He will continue to benefit from Quad strength and stability and SL stability. He continues to have bothersome pain with sitting/rest with knees at [place]. Pt with frustration wtih continued pain.    Personal Factors and Comorbidities Time since onset of injury/illness/exacerbation;Past/Current Experience    Examination-Activity Limitations Bend;Squat;Stairs;Carry;Stand;Lift;Locomotion Level    Examination-Participation Restrictions Cleaning;Community  Activity;Shop;Driving;Yard Work;Laundry;Meal Prep;Occupation    Stability/Clinical Decision Making Stable/Uncomplicated    Rehab Potential Good    PT Frequency 2x / week    PT Duration 6 weeks    PT Treatment/Interventions ADLs/Self Care Home Management;Cryotherapy;Electrical Stimulation;Iontophoresis 4mg /ml Dexamethasone;Moist Heat;Traction;Balance training;Therapeutic exercise;Therapeutic activities;Functional mobility training;Stair training;Gait training;DME Instruction;Parrafin;Ultrasound;Neuromuscular re-education;Patient/family education;Orthotic Fit/Training;Vasopneumatic Device;Taping;Dry needling;Passive range of motion;Joint Manipulations;Spinal Manipulations;Manual techniques    PT Home Exercise Plan [place] and Agree with Plan of Care Patient             Patient will benefit from skilled therapeutic intervention in order to improve the following deficits and impairments:  Abnormal gait, Hypomobility, Decreased activity tolerance, Decreased strength, Pain, Increased fascial restricitons, Decreased balance, Decreased mobility, Increased muscle spasms, Decreased range of motion, Impaired flexibility  Visit Diagnosis: Acute pain of left knee     Problem List Patient Active Problem List   Diagnosis Date Noted   Morton's neuroma of right foot 03/17/2020   Chondral defect of left patella 01/30/2020   Patellofemoral pain syndrome of left knee 12/06/2019   Quadriceps tendinitis 07/25/2019   Gynecomastia 02/16/2019   Left shoulder pain 07/12/2018   Labral tear of shoulder, right, initial encounter 20/10/710   Oral lichen planus 19/75/8832   GAD (generalized anxiety disorder) 04/10/2018   Seasonal allergic rhinitis 11/24/2017   Allergic conjunctivitis 11/24/2017   Mouth lesion 11/24/2017   Eczema 10/28/2017   Right arm pain 04/25/2017  Lyndee Hensen, PT, DPT 9:33 PM  11/12/20   [facility] [address]  [place], [place], [address] Phone: [phone]   Fax:  [phone]  Name: Travis Rose MRN: [id number] Date of Birth: [birth year]

## 2020-11-17 ENCOUNTER — Ambulatory Visit (INDEPENDENT_AMBULATORY_CARE_PROVIDER_SITE_OTHER): Payer: 59 | Admitting: Physical Therapy

## 2020-11-17 ENCOUNTER — Encounter: Payer: Self-pay | Admitting: Physical Therapy

## 2020-11-17 ENCOUNTER — Other Ambulatory Visit: Payer: Self-pay

## 2020-11-17 DIAGNOSIS — M25562 Pain in left knee: Secondary | ICD-10-CM | POA: Diagnosis not present

## 2020-11-17 NOTE — Therapy (Signed)
[facility] [address] [place], [place], [address] Phone: [phone]   Fax:  [phone]  Physical Therapy Treatment  Patient Details  Name: Travis Rose MRN: [id number] Date of Birth: 01-21-1970 Referring Provider (PT): Teresa Coombs   Encounter Date: 11/17/2020   PT End of Session - 11/17/20 0900     Visit Number 9    Number of Visits 16    Date for PT Re-Evaluation 11/27/20    Authorization Type UHC    PT Start Time 0850    PT Stop Time 0928    PT Time Calculation (min) 38 min    Activity Tolerance Patient tolerated treatment well    Behavior During Therapy [facility] for tasks assessed/performed             Past Medical History:  Diagnosis Date   Allergy     Past Surgical History:  Procedure Laterality Date   NO PAST SURGERIES      There were no vitals filed for this visit.   Subjective Assessment - 11/17/20 0859     Subjective Pt states mild improvements with pain at rest and with sitting.    Currently in Pain? Yes    Pain Score 3     Pain Location Knee    Pain Orientation Left    Pain Descriptors / Indicators Aching    Pain Type Acute pain    Pain Onset More than a month ago    Pain Frequency Intermittent                               [facility] Adult PT Treatment/Exercise - 11/17/20 0001       Knee/Hip Exercises: Aerobic   Recumbent Bike L2 x 8 min      Knee/Hip Exercises: Standing   Forward Step Up 10 reps;Both;Hand Hold: 0;Step Height: 6"    Forward Step Up Limitations 12 in step    Functional Squat 20 reps    Functional Squat Limitations 25 lb    SLS with Vectors SLS with UE T-band rotation GTB x 15 bil;    Other Standing Knee Exercises Lunges onto BOSU  x 15 ea;    Other Standing Knee Exercises Step downs x 10 bil;      Knee/Hip Exercises: Seated   Long Arc Quad 20 reps    Long Arc Quad Weight 8 lbs.    Long Arc Quad Limitations slow eccentric lowering      Knee/Hip  Exercises: Supine   Bridges 20 reps                       PT Short Term Goals - 11/06/20 0857       PT SHORT TERM GOAL #1   Title Patient will be independent with initial HEP    Time 2    Period Weeks    Status Achieved    Target Date 10/16/20               PT Long Term Goals - 10/07/20 2158       PT LONG TERM GOAL #1   Title Patient will be independent with final  HEP    Time 8    Period Weeks    Status New    Target Date 11/27/20      PT LONG TERM GOAL #2   Title Pt to report decreased pain in L knee to  0-2/10 with standing, walking, and exercise for at least 30 min    Time 8    Period Weeks    Status New    Target Date 11/27/20      PT LONG TERM GOAL #3   Title Pt to demo L quad length to be WNL/equal to R.    Time 6    Period Weeks    Status New    Target Date 11/13/20      PT LONG TERM GOAL #4   Title Pt to demo full functional strength of L quad, with squats, stairs, and single leg activity, to improve ability for return to exercise, community, and functional activites without pain and to reduce risk for re-injury    Time 8    Period Weeks    Status New    Target Date 11/27/20                   Plan - 11/17/20 1205     Clinical Impression Statement Pt has f/u with MD later this week. He is showing improvments in ability and stability with quad strenthening. Pain at rest seems to be improving in last few days. Pt to benefit from continued strenthening and stability for quad and LE .    Personal Factors and Comorbidities Time since onset of injury/illness/exacerbation;Past/Current Experience    Examination-Activity Limitations Bend;Squat;Stairs;Carry;Stand;Lift;Locomotion Level    Examination-Participation Restrictions Cleaning;Community Activity;Shop;Driving;Yard Work;Laundry;Meal Prep;Occupation    Stability/Clinical Decision Making Stable/Uncomplicated    Rehab Potential Good    PT Frequency 2x / week    PT Duration 6 weeks     PT Treatment/Interventions ADLs/Self Care Home Management;Cryotherapy;Electrical Stimulation;Iontophoresis 4mg /ml Dexamethasone;Moist Heat;Traction;Balance training;Therapeutic exercise;Therapeutic activities;Functional mobility training;Stair training;Gait training;DME Instruction;Parrafin;Ultrasound;Neuromuscular re-education;Patient/family education;Orthotic Fit/Training;Vasopneumatic Device;Taping;Dry needling;Passive range of motion;Joint Manipulations;Spinal Manipulations;Manual techniques    PT Home Exercise Plan [place] and Agree with Plan of Care Patient             Patient will benefit from skilled therapeutic intervention in order to improve the following deficits and impairments:  Abnormal gait, Hypomobility, Decreased activity tolerance, Decreased strength, Pain, Increased fascial restricitons, Decreased balance, Decreased mobility, Increased muscle spasms, Decreased range of motion, Impaired flexibility  Visit Diagnosis: Acute pain of left knee     Problem List Patient Active Problem List   Diagnosis Date Noted   Morton's neuroma of right foot 03/17/2020   Chondral defect of left patella 01/30/2020   Patellofemoral pain syndrome of left knee 12/06/2019   Quadriceps tendinitis 07/25/2019   Gynecomastia 02/16/2019   Left shoulder pain 07/12/2018   Labral tear of shoulder, right, initial encounter 70/96/4383   Oral lichen planus 81/84/0375   GAD (generalized anxiety disorder) 04/10/2018   Seasonal allergic rhinitis 11/24/2017   Allergic conjunctivitis 11/24/2017   Mouth lesion 11/24/2017   Eczema 10/28/2017   Right arm pain 04/25/2017   Lyndee Hensen, PT, DPT 12:06 PM  11/17/20    [facility] [address] [place], [place], [address] Phone: [phone]   Fax:  [phone]  Name: Travis Rose MRN: [id number] Date of Birth: 19-Dec-1970

## 2020-11-21 ENCOUNTER — Ambulatory Visit (HOSPITAL_BASED_OUTPATIENT_CLINIC_OR_DEPARTMENT_OTHER): Payer: 59 | Admitting: Orthopaedic Surgery

## 2020-11-24 ENCOUNTER — Ambulatory Visit (INDEPENDENT_AMBULATORY_CARE_PROVIDER_SITE_OTHER): Payer: 59 | Admitting: Physical Therapy

## 2020-11-24 ENCOUNTER — Other Ambulatory Visit: Payer: Self-pay

## 2020-11-24 DIAGNOSIS — M25562 Pain in left knee: Secondary | ICD-10-CM

## 2020-11-26 ENCOUNTER — Encounter: Payer: Self-pay | Admitting: Physical Therapy

## 2020-11-26 NOTE — Therapy (Signed)
[facility] [address] [place], [place], [address] Phone: [phone]   Fax:  [phone]  Physical Therapy Treatment/Re-Cert   Patient Details  Name: Travis Rose MRN: [id number] Date of Birth: 01/12/1970 Referring Provider (PT): Teresa Coombs   Encounter Date: 11/24/2020   PT End of Session - 11/26/20 1219     Visit Number 10    Number of Visits 20    Date for PT Re-Evaluation 01/05/21    Authorization Type UHC    PT Start Time 0847    PT Stop Time 0930    PT Time Calculation (min) 43 min    Activity Tolerance Patient tolerated treatment well    Behavior During Therapy [facility] for tasks assessed/performed             Past Medical History:  Diagnosis Date   Allergy     Past Surgical History:  Procedure Laterality Date   NO PAST SURGERIES      There were no vitals filed for this visit.   Subjective Assessment - 11/26/20 1218     Subjective Pt states doing better overall, but still has soreness in joint line, with increased activity and with rest at times.    Currently in Pain? Yes    Pain Score 3     Pain Location Knee    Pain Orientation Left    Pain Descriptors / Indicators Aching    Pain Type Acute pain    Pain Onset More than a month ago    Pain Frequency Intermittent                [facility] PT Assessment - 11/26/20 0001       Assessment   Medical Diagnosis L knee pain    Referring Provider (PT) Teresa Coombs    Onset Date/Surgical Date 09/26/20    Prior Therapy last year      Precautions   Precautions None      Balance Screen   Has the patient fallen in the past 6 months No      Prior Function   Level of Independence Independent      Cognition   Overall Cognitive Status Within Functional Limits for tasks assessed      AROM   Right Knee Extension 0    Right Knee Flexion 133    Left Knee Extension 0    Left Knee Flexion --   WNL     Strength   Overall Strength Comments R: WFL,  L: quad, mild limitation for Single leg, and eccentric motions,  4+/5                           [facility] Adult PT Treatment/Exercise - 11/26/20 0001       Knee/Hip Exercises: Stretches   Sports administrator Limitations manual      Knee/Hip Exercises: Aerobic   Recumbent Bike L2 x 8 min      Knee/Hip Exercises: Plyometrics   Bilateral Jumping Limitations 2x20 small hops;  x15 medium squat jumps      Knee/Hip Exercises: Standing   Forward Step Up 10 reps;Both;Hand Hold: 0;Step Height: 6"    Forward Step Up Limitations 12 in step    Functional Squat 20 reps    Functional Squat Limitations 25 lb    Other Standing Knee Exercises Lunges onto BOSU  x 15 ea; Lateral hops on BOSU x 20;    Other Standing Knee Exercises BOSU  squats x 20;      Knee/Hip Exercises: Seated   Long Arc Quad 20 reps    Long Arc Quad Weight 8 lbs.    Long Arc Quad Limitations slow eccentric lowering    Sit to [employer] 15 reps   Single Leg, 10 lb                    PT Education - 11/26/20 1219     Education Details Reviewed HEP    Person(s) Educated Patient    Methods Explanation;Demonstration;Verbal cues;Handout;Tactile cues    Comprehension Verbalized understanding;Returned demonstration;Verbal cues required;Tactile cues required;Need further instruction              PT Short Term Goals - 11/06/20 0857       PT SHORT TERM GOAL #1   Title Patient will be independent with initial HEP    Time 2    Period Weeks    Status Achieved    Target Date 10/16/20               PT Long Term Goals - 11/26/20 1221       PT LONG TERM GOAL #1   Title Patient will be independent with final  HEP    Time 8    Period Weeks    Status Partially Met    Target Date 01/05/21      PT LONG TERM GOAL #2   Title Pt to report decreased pain in L knee to 0-2/10 with standing, walking, and exercise for at least 30 min    Time 8    Period Weeks    Status Partially Met    Target Date 01/05/21       PT LONG TERM GOAL #3   Title Pt to demo L quad length to be WNL/equal to R.    Time 6    Period Weeks    Status Partially Met    Target Date 01/05/21      PT LONG TERM GOAL #4   Title Pt to demo full functional strength of L quad, with squats, stairs, and single leg activity, to improve ability for return to exercise, community, and functional activites without pain and to reduce risk for re-injury    Time 8    Period Weeks    Status Partially Met    Target Date 01/05/21                   Plan - 11/26/20 1221     Clinical Impression Statement Pt has been seen for 10 visits. Overall he is showing good improvements with functional quad strength and stability. He has much improved ability and stability seen with exercises since start of care. He is doing well with HEP and activity. He has not yet progressed to all activity that he would like to be doing (running). He continues to have bothersome soreness in knee, mostly in joint line, with increased activity and at rest, sitting/sleeping that he is concerned with. He does have strength limitations in LE due to long duration of dysfunction in leg, and will benefit from continued care for strengthening. He will also see ortho MD for 2nd opinon on pain next week.    Personal Factors and Comorbidities Time since onset of injury/illness/exacerbation;Past/Current Experience    Examination-Activity Limitations Bend;Squat;Stairs;Carry;Stand;Lift;Locomotion Level    Examination-Participation Restrictions Cleaning;Community Activity;Shop;Driving;Yard Work;Laundry;Meal Prep;Occupation    Stability/Clinical Decision Making Stable/Uncomplicated    Rehab Potential Good    PT  Frequency 2x / week    PT Duration 6 weeks    PT Treatment/Interventions ADLs/Self Care Home Management;Cryotherapy;Electrical Stimulation;Iontophoresis 39m/ml Dexamethasone;Moist Heat;Traction;Balance training;Therapeutic exercise;Therapeutic activities;Functional mobility  training;Stair training;Gait training;DME Instruction;Parrafin;Ultrasound;Neuromuscular re-education;Patient/family education;Orthotic Fit/Training;Vasopneumatic Device;Taping;Dry needling;Passive range of motion;Joint Manipulations;Spinal Manipulations;Manual techniques    PT Home Exercise [place] Agree with Plan of Care Patient             Patient will benefit from skilled therapeutic intervention in order to improve the following deficits and impairments:  Abnormal gait, Hypomobility, Decreased activity tolerance, Decreased strength, Pain, Increased fascial restricitons, Decreased balance, Decreased mobility, Increased muscle spasms, Decreased range of motion, Impaired flexibility  Visit Diagnosis: Acute pain of left knee     Problem List Patient Active Problem List   Diagnosis Date Noted   Morton's neuroma of right foot 03/17/2020   Chondral defect of left patella 01/30/2020   Patellofemoral pain syndrome of left knee 12/06/2019   Quadriceps tendinitis 07/25/2019   Gynecomastia 02/16/2019   Left shoulder pain 07/12/2018   Labral tear of shoulder, right, initial encounter 066/81/5947  Oral lichen planus 007/61/5183  GAD (generalized anxiety disorder) 04/10/2018   Seasonal allergic rhinitis 11/24/2017   Allergic conjunctivitis 11/24/2017   Mouth lesion 11/24/2017   Eczema 10/28/2017   Right arm pain 04/25/2017    LLyndee Hensen PT, DPT 12:27 PM  11/26/20   [place] [place] [address]Phone: [phone]  Fax:  [phone] Name: Travis ConnorsMRN: [id number] of Birth: 117-Aug-1972

## 2020-12-01 ENCOUNTER — Other Ambulatory Visit: Payer: Self-pay

## 2020-12-01 ENCOUNTER — Ambulatory Visit (INDEPENDENT_AMBULATORY_CARE_PROVIDER_SITE_OTHER): Payer: 59 | Admitting: Orthopaedic Surgery

## 2020-12-01 DIAGNOSIS — M2242 Chondromalacia patellae, left knee: Secondary | ICD-10-CM

## 2020-12-01 NOTE — Progress Notes (Signed)
Chief Complaint: left knee pain     History of Present Illness:   Travis Rose is a 50 y.o. male presents with left knee pain that has been going on for approximately 2 years.  He states that he did have a fall directly onto this knee in 2015 where he directly landed on the knee.  Since that time but progressively over the last several years he has had increasing swelling and pain about the knee.  This occurs predominantly on stairs can occur while he is just at rest on noticed that he experiences pain.  He does prefer to be quite active and enjoys running and going to the gym.  This has been significantly limited due to the anterior based knee pain.  He has had extensive treatment including physical therapy as well as injections both steroid and PRP in 2001 without significant relief.  He underwent a Tenex percutaneous tenotomy procedure for presumed quad tendon pain.  He has been seen for orthopedic evaluation previously and was referred for physical therapy today.  He is here today for further treatment and assessment as he has now had multiple injections including tenotomy procedure and physical therapy none of which has been able to treat his pain.    Surgical History:   None  PMH/PSH/Family History/Social History/Meds/Allergies:    Past Medical History:  Diagnosis Date   Allergy    Past Surgical History:  Procedure Laterality Date   NO PAST SURGERIES     Social History   Socioeconomic History   Marital status: Single    Spouse name: Not on file   Number of children: Not on file   Years of education: Not on file   Highest education level: Not on file  Occupational History   Not on file  Tobacco Use   Smoking status: Former    Packs/day: 0.50    Years: 5.00    Pack years: 2.50    Types: Cigarettes    Quit date: 01/04/2002    Years since quitting: 18.9   Smokeless tobacco: Never  Vaping Use   Vaping Use: Never used  Substance and  Sexual Activity   Alcohol use: Yes    Alcohol/week: 2.0 standard drinks    Types: 2 Cans of beer per week   Drug use: No   Sexual activity: Not on file  Other Topics Concern   Not on file  Social History Narrative   Not on file   Social Determinants of Health   Financial Resource Strain: Not on file  Food Insecurity: Not on file  Transportation Needs: Not on file  Physical Activity: Not on file  Stress: Not on file  Social Connections: Not on file   Family History  Problem Relation Age of Onset   Allergic rhinitis Mother    Allergic rhinitis Brother    Asthma Brother    Angioedema Neg Hx    Eczema Neg Hx    Immunodeficiency Neg Hx    Urticaria Neg Hx    Other Neg Hx        gynecomastia   No Known Allergies Current Outpatient Medications  Medication Sig Dispense Refill   ALPRAZolam (XANAX) 0.5 MG tablet Take 1 tablet (0.5 mg total) by mouth daily as needed for anxiety. 30 tablet 2   nitroGLYCERIN (NITRODUR - DOSED  IN MG/24 HR) 0.2 mg/hr patch Cut and apply 1/4 patch to most painful area q24h. 30 patch 11   PENNSAID 2 % SOLN APPLY 1 PUMP (1 GRAM) TO AFFECTED AREA TOPICALLY TWICE DAILY AS DIRECTED 112 g 2   No current facility-administered medications for this visit.   No results found.  Review of Systems:   A ROS was performed including pertinent positives and negatives as documented in the HPI.  Physical Exam :   Constitutional: NAD and appears stated age Neurological: Alert and oriented Psych: Appropriate affect and cooperative There were no vitals taken for this visit.   Comprehensive Musculoskeletal Exam:      Musculoskeletal Exam  Gait Normal  Alignment Normal   Right Left  Inspection Normal Normal  Palpation    Tenderness None Patellofemoral  Crepitus None None  Effusion None Mild  Range of Motion    Extension 0 0  Flexion 135 135  Strength    Extension 5/5 5/5  Flexion 5/5 5/5  Ligament Exam     Generalized Laxity No No  Lachman Negative  Negative   Pivot Shift Negative Negative  Anterior Drawer Negative Negative  Valgus at 0 Negative Negative  Valgus at 20 Negative Negative  Varus at 0 0 0  Varus at 20   0 0  Posterior Drawer at 90 0 0  Vascular/Lymphatic Exam    Edema None None  Venous Stasis Changes No No  Distal Circulation Normal Normal  Neurologic    Light Touch Sensation Intact Intact  Special Tests: 2 quadrants of patellar motion medially and laterally bilaterally     Imaging:   Xray (4 views left knee): Normal  MRI (left knee): There is a chondral lesion full-thickness under the patella with underlying bony bruise  I personally reviewed and interpreted the radiographs.   Assessment:   50 year old male with left patellofemoral type pain in the setting of a full-thickness chondral lesion of the left knee which likely occurred in 2015 when he directly landed on the side.  Since that time he has had significant patellofemoral type symptoms which have significantly impaired his ability to stay active and the types of way that he enjoys.  I described that his exam and history are very consistent with patellofemoral type pain.  We described that swelling occurs commonly after a chondral issue particularly with full-thickness cartilage loss.  He has trialed essentially every nonoperative management conceivable including physical therapy, Tenex procedure, PRP, steroid injections, medical management, activity restriction, bracing.  I do believe that there is room for improvement in terms of working on his hip and core strength of the left leg in order to improve the kinetic chain as I do believe this would ultimately help his knee pain, although given the fact that he has failed multiple bouts of nonoperative management not completely faithful that he will be able to manage this without surgery.  I discussed the role for surgical debridement in order to fully characterize the size of the patella lesion and at that time  performing MACI harvest in preparation for future autologous chondrocyte implantation.  He will consider his options as he currently has a dog that is undergoing ACL reconstruction.  Plan :    -He will call us back after he is considered more about his options, in the meantime I do believe that incorporating more hip strengthening and core strengthening and into his therapy program will be ideal    I personally saw and evaluated the patient, and  participated in the management and treatment plan.  Vanetta Mulders, MD Attending Physician, Orthopedic Surgery  This document was dictated using Dragon voice recognition software. A reasonable attempt at proof reading has been made to minimize errors.

## 2020-12-02 ENCOUNTER — Ambulatory Visit (INDEPENDENT_AMBULATORY_CARE_PROVIDER_SITE_OTHER): Payer: 59 | Admitting: Physical Therapy

## 2020-12-02 DIAGNOSIS — M25562 Pain in left knee: Secondary | ICD-10-CM

## 2020-12-02 NOTE — Patient Instructions (Signed)
Access Code: [facility] URL: [email or web address] Date: 12/02/2020 Prepared by: Lyndee Hensen  Exercises Supine Quadricep Sets - 2 x daily - 2 sets - 10 reps Straight Leg Raise - 1 x daily - 2 sets - 10 reps Seated Hamstring Stretch - 2 x daily - 3 reps - 30 hold Prone Quadriceps Stretch with Strap - 2 x daily - 3 reps - 10 hold Single Leg Deadlift with Kettlebell - 1 x daily - 1-2 sets - 10 reps Mini Squat - 1 x daily - 2 sets - 10 reps Sit to Stand - 1 x daily - 1-2 sets - 10 reps Step Up - 1 x daily - 1 sets - 10 reps Runner's Step Up on BOSU Ball - 1 x daily - 1-2 sets - 10 reps Side Plank on Knees - 1 x daily - 3 reps - 30 hold Side Stepping with Resistance at Thighs - 1 x daily - 2 sets - 10 reps

## 2020-12-03 ENCOUNTER — Encounter: Payer: Self-pay | Admitting: Family Medicine

## 2020-12-03 ENCOUNTER — Other Ambulatory Visit: Payer: Self-pay | Admitting: Family Medicine

## 2020-12-03 ENCOUNTER — Encounter (HOSPITAL_BASED_OUTPATIENT_CLINIC_OR_DEPARTMENT_OTHER): Payer: Self-pay | Admitting: Orthopaedic Surgery

## 2020-12-03 DIAGNOSIS — N62 Hypertrophy of breast: Secondary | ICD-10-CM

## 2020-12-10 ENCOUNTER — Encounter: Payer: 59 | Admitting: Physical Therapy

## 2020-12-15 ENCOUNTER — Encounter: Payer: Self-pay | Admitting: Physical Therapy

## 2020-12-15 ENCOUNTER — Ambulatory Visit (INDEPENDENT_AMBULATORY_CARE_PROVIDER_SITE_OTHER): Payer: 59 | Admitting: Physical Therapy

## 2020-12-15 DIAGNOSIS — M25562 Pain in left knee: Secondary | ICD-10-CM

## 2020-12-15 NOTE — Therapy (Signed)
[facility] [address] [place], [place], [address] Phone: [phone]   Fax:  [phone]  Physical Therapy Treatment  Patient Details  Name: Travis Rose MRN: [id number] Date of Birth: 1970-04-01 Referring Provider (PT): Teresa Coombs   Encounter Date: 12/02/2020   PT End of Session - 12/15/20 0945     Visit Number 11    Number of Visits 20    Date for PT Re-Evaluation 01/05/21    Authorization Type UHC    PT Start Time 0845    PT Stop Time 0929    PT Time Calculation (min) 44 min    Activity Tolerance Patient tolerated treatment well    Behavior During Therapy [facility] for tasks assessed/performed             Past Medical History:  Diagnosis Date   Allergy     Past Surgical History:  Procedure Laterality Date   NO PAST SURGERIES      There were no vitals filed for this visit.   Subjective Assessment - 12/15/20 0942     Subjective Pt had appt yesterday with new Ortho MD. Reports doing well overall with activity, but still feels pain, mostly around knee cap and joint line.    Patient Stated Goals decreased pain, return to activity    Currently in Pain? Yes    Pain Score 3     Pain Location Knee    Pain Orientation Left    Pain Descriptors / Indicators Aching    Pain Type Acute pain    Pain Onset More than a month ago    Pain Frequency Intermittent                               [facility] Adult PT Treatment/Exercise - 12/15/20 0001       Knee/Hip Exercises: Stretches   Sports administrator Limitations manual      Knee/Hip Exercises: Aerobic   Recumbent Bike L2 x 8 min      Knee/Hip Exercises: Standing   Functional Squat 20 reps    Functional Squat Limitations 25 lb    Other Standing Knee Exercises Lunges onto BOSU  x 15 ea; Lateral hops on BOSU x 20;    Other Standing Knee Exercises BOSU squats x 20; Lateral band walkd Blue TB , 25 ft x 6; HIp abd GTB x 20 bil;      Knee/Hip Exercises:  Seated   Long Arc Quad 20 reps    Long Arc Quad Weight 8 lbs.    Long Arc Quad Limitations slow eccentric lowering    Sit to [employer] 15 reps   Single Leg, 10 lb                    PT Education - 12/15/20 [phone]     Education Details reviewed HEP, PT plan,    Person(s) Educated Patient    Methods Explanation;Demonstration;Tactile cues;Verbal cues;Handout    Comprehension Verbalized understanding;Returned demonstration;Verbal cues required;Tactile cues required;Need further instruction              PT Short Term Goals - 11/06/20 0857       PT SHORT TERM GOAL #1   Title Patient will be independent with initial HEP    Time 2    Period Weeks    Status Achieved    Target Date 10/16/20  PT Long Term Goals - 11/26/20 1221       PT LONG TERM GOAL #1   Title Patient will be independent with final  HEP    Time 8    Period Weeks    Status Partially Met    Target Date 01/05/21      PT LONG TERM GOAL #2   Title Pt to report decreased pain in L knee to 0-2/10 with standing, walking, and exercise for at least 30 min    Time 8    Period Weeks    Status Partially Met    Target Date 01/05/21      PT LONG TERM GOAL #3   Title Pt to demo L quad length to be WNL/equal to R.    Time 6    Period Weeks    Status Partially Met    Target Date 01/05/21      PT LONG TERM GOAL #4   Title Pt to demo full functional strength of L quad, with squats, stairs, and single leg activity, to improve ability for return to exercise, community, and functional activites without pain and to reduce risk for re-injury    Time 8    Period Weeks    Status Partially Met    Target Date 01/05/21                   Plan - 12/15/20 0947     Clinical Impression Statement Pt overall doing well functionally, with daily tasks , work duties, and exercise. He does have bothersome pain that limits full activity and exercise. He has most pain at patella and joint line,  consistent with patella femoral pathology. Discussed progress, function, and d/c plan. Pt to be seen for 1 more visit, in 2 weeks. He will work on strengthening with his HEP until then. Likley d/c at that time, if doing well, and pt will continue to f/u with new Ortho for ongoing pain. Pt in agreement with plan.    Personal Factors and Comorbidities Time since onset of injury/illness/exacerbation;Past/Current Experience    Examination-Activity Limitations Bend;Squat;Stairs;Carry;Stand;Lift;Locomotion Level    Examination-Participation Restrictions Cleaning;Community Activity;Shop;Driving;Yard Work;Laundry;Meal Prep;Occupation    Stability/Clinical Decision Making Stable/Uncomplicated    Rehab Potential Good    PT Frequency 2x / week    PT Duration 6 weeks    PT Treatment/Interventions ADLs/Self Care Home Management;Cryotherapy;Electrical Stimulation;Iontophoresis 4mg /ml Dexamethasone;Moist Heat;Traction;Balance training;Therapeutic exercise;Therapeutic activities;Functional mobility training;Stair training;Gait training;DME Instruction;Parrafin;Ultrasound;Neuromuscular re-education;Patient/family education;Orthotic Fit/Training;Vasopneumatic Device;Taping;Dry needling;Passive range of motion;Joint Manipulations;Spinal Manipulations;Manual techniques    PT Home Exercise Plan [place] and Agree with Plan of Care Patient             Patient will benefit from skilled therapeutic intervention in order to improve the following deficits and impairments:  Abnormal gait, Hypomobility, Decreased activity tolerance, Decreased strength, Pain, Increased fascial restricitons, Decreased balance, Decreased mobility, Increased muscle spasms, Decreased range of motion, Impaired flexibility  Visit Diagnosis: Acute pain of left knee     Problem List Patient Active Problem List   Diagnosis Date Noted   Morton's neuroma of right foot 03/17/2020   Chondral defect of left patella 01/30/2020    Patellofemoral pain syndrome of left knee 12/06/2019   Quadriceps tendinitis 07/25/2019   Gynecomastia 02/16/2019   Left shoulder pain 07/12/2018   Labral tear of shoulder, right, initial encounter 01/60/1093   Oral lichen planus 23/55/7322   GAD (generalized anxiety disorder) 04/10/2018   Seasonal allergic rhinitis 11/24/2017   Allergic  conjunctivitis 11/24/2017   Mouth lesion 11/24/2017   Eczema 10/28/2017   Right arm pain 04/25/2017   Lyndee Hensen, PT, DPT 9:53 AM  12/15/20    [facility] [place], [place], [address] Phone: [phone]   Fax:  [phone]  Name: Pasco Marchitto MRN: [id number] Date of Birth: 04/22/1970

## 2020-12-15 NOTE — Therapy (Signed)
[facility] [address] [place], [place], [address] Phone: [phone]   Fax:  [phone]  Physical Therapy Treatment/Discharge   Patient Details  Name: Travis Rose MRN: [id number] Date of Birth: 10/06/1970 Referring Provider (PT): Teresa Coombs   Encounter Date: 12/15/2020   PT End of Session - 12/15/20 1151     Visit Number 12    Number of Visits 20    Date for PT Re-Evaluation 01/05/21    Authorization Type UHC    PT Start Time 1100    PT Stop Time 1135    PT Time Calculation (min) 35 min    Activity Tolerance Patient tolerated treatment well    Behavior During Therapy [facility] for tasks assessed/performed             Past Medical History:  Diagnosis Date   Allergy     Past Surgical History:  Procedure Laterality Date   NO PAST SURGERIES      There were no vitals filed for this visit.   Subjective Assessment - 12/15/20 1149     Subjective Pt states doing well with exercise and activity. Has been able to do most things that he wants to do, but does note continued pain around knee cap, and some at distal quad tendon, after increased activity. He is sleeping well with less pain at night.    Patient Stated Goals decreased pain, return to activity    Currently in Pain? Yes    Pain Score 3     Pain Location Knee    Pain Orientation Left    Pain Descriptors / Indicators Aching    Pain Type Acute pain    Pain Onset More than a month ago    Pain Frequency Intermittent                               [facility] Adult PT Treatment/Exercise - 12/15/20 1147       Knee/Hip Exercises: Stretches   Quad Stretch Limitations manual      Knee/Hip Exercises: Aerobic   Recumbent Bike L2 x 8 min      Knee/Hip Exercises: Standing   Functional Squat 20 reps    Stairs up/down 5 steps, 6 in, no HR, recipricol.    Other Standing Knee Exercises Lateral band walkd Black TB , 25 ft x 6; HIp abd GTB x 20 bil;       Knee/Hip Exercises: Seated   Sit to Sand 10 reps   Single Leg, mild soreness with eccentric lowering;  double leg x 10 no pain.                    PT Education - 12/15/20 1150     Education Details Final HEP reviewed, reviewed gym equipment and best ther ex for knee pain, discussed f/u with MD.    Terence Lux) Educated Patient    Methods Explanation;Demonstration;Verbal cues    Comprehension Verbalized understanding;Returned demonstration;Verbal cues required              PT Short Term Goals - 11/06/20 0857       PT SHORT TERM GOAL #1   Title Patient will be independent with initial HEP    Time 2    Period Weeks    Status Achieved    Target Date 10/16/20               PT Long Term Goals -  12/15/20 1151       PT LONG TERM GOAL #1   Title Patient will be independent with final  HEP    Time 8    Period Weeks    Status Achieved    Target Date 01/05/21      PT LONG TERM GOAL #2   Title Pt to report decreased pain in L knee to 0-2/10 with standing, walking, and exercise for at least 30 min    Time 8    Period Weeks    Status Partially Met    Target Date 01/05/21      PT LONG TERM GOAL #3   Title Pt to demo L quad length to be WNL/equal to R.    Time 6    Period Weeks    Status Achieved    Target Date 01/05/21      PT LONG TERM GOAL #4   Title Pt to demo full functional strength of L quad, with squats, stairs, and single leg activity, to improve ability for return to exercise, community, and functional activites without pain and to reduce risk for re-injury    Time 8    Period Weeks    Status Achieved    Target Date 01/05/21                   Plan - 12/15/20 1152     Clinical Impression Statement Pt doing well functionally, but continues to have bothersome pain in lateral patella femoral region with increased activity. Pain is mild, and intermittent, and he is tolerating it well, but is also frustrated with long time frame of  continued pain. He is doing well with strengthening exercises at this time, and will be d/c to HEP. HEP, and best exercises reviewed today, as well as gym equipment, and focus on closed chain exercise. He will f/u with new Ortho in near future. Pt in agreement with plan.    Personal Factors and Comorbidities Time since onset of injury/illness/exacerbation;Past/Current Experience    Examination-Activity Limitations Bend;Squat;Stairs;Carry;Stand;Lift;Locomotion Level    Examination-Participation Restrictions Cleaning;Community Activity;Shop;Driving;Yard Work;Laundry;Meal Prep;Occupation    Stability/Clinical Decision Making Stable/Uncomplicated    Rehab Potential Good    PT Frequency 2x / week    PT Duration 6 weeks    PT Treatment/Interventions ADLs/Self Care Home Management;Cryotherapy;Electrical Stimulation;Iontophoresis 79m/ml Dexamethasone;Moist Heat;Traction;Balance training;Therapeutic exercise;Therapeutic activities;Functional mobility training;Stair training;Gait training;DME Instruction;Parrafin;Ultrasound;Neuromuscular re-education;Patient/family education;Orthotic Fit/Training;Vasopneumatic Device;Taping;Dry needling;Passive range of motion;Joint Manipulations;Spinal Manipulations;Manual techniques    PT Home Exercise Plan [place] Agree with Plan of Care Patient             Patient will benefit from skilled therapeutic intervention in order to improve the following deficits and impairments:  Abnormal gait, Hypomobility, Decreased activity tolerance, Decreased strength, Pain, Increased fascial restricitons, Decreased balance, Decreased mobility, Increased muscle spasms, Decreased range of motion, Impaired flexibility  Visit Diagnosis: Acute pain of left knee     Problem List Patient Active Problem List   Diagnosis Date Noted   Morton's neuroma of right foot 03/17/2020   Chondral defect of left patella 01/30/2020   Patellofemoral pain syndrome of left knee  12/06/2019   Quadriceps tendinitis 07/25/2019   Gynecomastia 02/16/2019   Left shoulder pain 07/12/2018   Labral tear of shoulder, right, initial encounter 014/43/1540  Oral lichen planus 008/67/6195  GAD (generalized anxiety disorder) 04/10/2018   Seasonal allergic rhinitis 11/24/2017   Allergic conjunctivitis 11/24/2017   Mouth lesion 11/24/2017   Eczema 10/28/2017  Right arm pain 04/25/2017   Lyndee Hensen, PT, DPT 11:57 AM  12/15/20    [facility] [place], [place], [address] Phone: [phone]   Fax:  [phone]  Name: Quadarius Henton MRN: [id number] Date of Birth: February 11, 1970

## 2021-01-27 ENCOUNTER — Other Ambulatory Visit: Payer: Self-pay | Admitting: Family Medicine

## 2021-01-27 ENCOUNTER — Encounter: Payer: Self-pay | Admitting: Family Medicine

## 2021-01-27 MED ORDER — DICLOFENAC SODIUM 2 % EX SOLN: CUTANEOUS | 2 refills | Status: DC

## 2021-02-13 ENCOUNTER — Ambulatory Visit (INDEPENDENT_AMBULATORY_CARE_PROVIDER_SITE_OTHER): Payer: 59 | Admitting: Family Medicine

## 2021-02-13 ENCOUNTER — Encounter: Payer: Self-pay | Admitting: Family Medicine

## 2021-02-13 VITALS — BP 108/68 | HR 68 | Temp 98.7°F | Ht 68.0 in | Wt 167.0 lb

## 2021-02-13 DIAGNOSIS — G514 Facial myokymia: Secondary | ICD-10-CM | POA: Diagnosis not present

## 2021-02-13 DIAGNOSIS — G245 Blepharospasm: Secondary | ICD-10-CM

## 2021-02-13 DIAGNOSIS — Z114 Encounter for screening for human immunodeficiency virus [HIV]: Secondary | ICD-10-CM

## 2021-02-13 DIAGNOSIS — Z113 Encounter for screening for infections with a predominantly sexual mode of transmission: Secondary | ICD-10-CM | POA: Diagnosis not present

## 2021-02-13 LAB — MAGNESIUM: Magnesium: 1.9 mg/dL (ref 1.5–2.5)

## 2021-02-13 LAB — BASIC METABOLIC PANEL
BUN: 24 mg/dL — ABNORMAL HIGH (ref 6–23)
CO2: 31 mEq/L (ref 19–32)
Calcium: 9.6 mg/dL (ref 8.4–10.5)
Chloride: 104 mEq/L (ref 96–112)
Creatinine, Ser: 1.12 mg/dL (ref 0.40–1.50)
GFR: 76.26 mL/min (ref >60.00)
Glucose, Bld: 82 mg/dL (ref 70–99)
Potassium: 4.3 mEq/L (ref 3.5–5.1)
Sodium: 141 mEq/L (ref 135–145)

## 2021-02-13 NOTE — Patient Instructions (Addendum)
Give [place] 2-3 business days to get the results of your labs back.   This is overwhelmingly normal in most people.   Stay hydrated.  Continue the mupirocin on the ingrown hair.   Let us know if you need anything.

## 2021-02-13 NOTE — Progress Notes (Signed)
Chief Complaint  Patient presents with   Eye Problem    Twitching for 2 months    Subjective: Patient is a 51 y.o. male here for f/u.  Over the past 2 months, the patient had twitching of his left lower eyelid.  It does not shut his eye and he denies any vision changes.  There is no pain or redness.  He does note he has lots of stress.  No changes in diet or hydration.  He stays very well-hydrated.  He is not having any spasming or twitching of any other muscle group.  He has not tried anything at home so far.  Past Medical History:  Diagnosis Date   Allergy     Objective: BP 108/68    Pulse 68    Temp 98.7 F (37.1 C) (Oral)    Ht 5\' 8"  (1.727 m)    Wt 167 lb (75.8 kg)    SpO2 98%    BMI 25.39 kg/m  General: Awake, appears stated age Eyes: PERRLA, EOMi, no ttp over lids, no injection, spasm of the lower eyelid noted Lungs: No accessory muscle use Neuro: 5/5 strength throughout, DTRs equal and symmetric throughout with exception of 1/4 patellar reflex on the left compared to 2/4 patellar reflex on the right attributable to a patellar fracture in 2015.  Gait is normal. Psych: Age appropriate judgment and insight, normal affect and mood  Assessment and Plan: Spasm eyelid - Plan: Basic metabolic panel, Magnesium  Eyelid myokymia  Screening for HIV without presence of risk factors  Routine screening for STI (sexually transmitted infection) - Plan: GC/Chlamydia Probe Amp  Probably benign, unlikely to be MS.  Check electrolytes.  Will reassure if normal. The patient voiced understanding and agreement to the plan.  [place], DO 02/13/21  12:43 PM

## 2021-02-17 LAB — GC/CHLAMYDIA PROBE AMP
Chlamydia trachomatis, NAA: NEGATIVE
Neisseria Gonorrhoeae by PCR: NEGATIVE

## 2021-03-11 ENCOUNTER — Other Ambulatory Visit: Payer: Self-pay | Admitting: Family Medicine

## 2021-03-11 ENCOUNTER — Encounter: Payer: Self-pay | Admitting: Family Medicine

## 2021-03-11 DIAGNOSIS — N62 Hypertrophy of breast: Secondary | ICD-10-CM

## 2021-05-04 ENCOUNTER — Ambulatory Visit: Payer: 59 | Admitting: Family Medicine

## 2021-06-24 ENCOUNTER — Encounter (INDEPENDENT_AMBULATORY_CARE_PROVIDER_SITE_OTHER): Payer: 59 | Admitting: Family Medicine

## 2021-06-24 DIAGNOSIS — N529 Male erectile dysfunction, unspecified: Secondary | ICD-10-CM | POA: Diagnosis not present

## 2021-06-24 MED ORDER — SILDENAFIL CITRATE 100 MG PO TABS: 50.0000 mg | ORAL_TABLET | Freq: Every day | ORAL | 2 refills | Status: DC | PRN

## 2021-06-24 NOTE — Telephone Encounter (Signed)
Please see the MyChart message reply(ies) for my assessment and plan.  The patient gave consent for this Medical Advice Message and is aware that it may result in a bill to their insurance company as well as the possibility that this may result in a co-payment or deductible. They are an established patient, but are not seeking medical advice exclusively about a problem treated during an in person or video visit in the last 7 days. I did not recommend an in person or video visit within 7 days of my reply.  I spent a total of 6 minutes cumulative time within 7 days through MyChart messaging Donoven Pett Paul Berta Denson, DO  

## 2021-09-02 ENCOUNTER — Other Ambulatory Visit: Payer: Self-pay

## 2021-09-02 ENCOUNTER — Encounter (HOSPITAL_BASED_OUTPATIENT_CLINIC_OR_DEPARTMENT_OTHER): Payer: Self-pay | Admitting: Emergency Medicine

## 2021-09-02 ENCOUNTER — Emergency Department (HOSPITAL_BASED_OUTPATIENT_CLINIC_OR_DEPARTMENT_OTHER)
Admission: EM | Admit: 2021-09-02 | Discharge: 2021-09-02 | Disposition: A | Discharge status: Home / Self Care | Payer: 59 | Attending: Emergency Medicine | Admitting: Emergency Medicine

## 2021-09-02 DIAGNOSIS — X58XXXA Exposure to other specified factors, initial encounter: Secondary | ICD-10-CM | POA: Insufficient documentation

## 2021-09-02 DIAGNOSIS — T162XXA Foreign body in left ear, initial encounter: Secondary | ICD-10-CM | POA: Diagnosis present

## 2021-09-02 MED ORDER — LIDOCAINE-EPINEPHRINE (PF) 2 %-1:200000 IJ SOLN
10.0000 mL | Freq: Once | INTRAMUSCULAR | Status: AC
  Administered 2021-09-02: 10 mL via INTRADERMAL
  Filled 2021-09-02: qty 20

## 2021-09-02 NOTE — ED Notes (Signed)
Left ear flushed with elephant ear wash, small moth washed out.

## 2021-09-02 NOTE — ED Provider Notes (Signed)
[facility] Provider Note   CSN: [id number] Arrival date & time: 09/02/21  2133     History  Chief Complaint  Patient presents with   Foreign Body in [place] is a 51 y.o. male.  51 yo M with a chief complaint of a moth flying into his left ear.  He was working in his garage and felt a fly in.  Since then has tried to get it out with both irrigation and with tweezers.  Has been unable.  Feels that fluttered its wings every 1 minutes or so.   Foreign Body in [place] Medications Prior to Admission medications   Medication Sig Start Date End Date Taking? Authorizing Provider  ALPRAZolam Duanne Moron) 0.5 MG tablet Take 1 tablet (0.5 mg total) by mouth daily as needed for anxiety. 11/04/20   Shelda Pal, DO  Diclofenac Sodium (PENNSAID) 2 % SOLN APPLY 1 PUMP (1 GRAM) TO AFFECTED AREA TOPICALLY TWICE DAILY AS DIRECTED 01/27/21   Rosemarie Ax, MD  sildenafil (VIAGRA) 100 MG tablet Take 0.5-1 tablets (50-100 mg total) by mouth daily as needed for erectile dysfunction. 06/24/21   Shelda Pal, DO      Allergies    Patient has no known allergies.    Review of Systems   Review of Systems  Physical Exam Updated Vital Signs BP (!) 118/94 (BP Location: Right Arm)   Pulse 95   Temp 98.5 F (36.9 C) (Oral)   Resp 18   Ht '5\' 8"'$  (1.727 m)   Wt 73.5 kg   SpO2 95%   BMI 24.63 kg/m  Physical Exam Vitals and nursing note reviewed.  Constitutional:      Appearance: He is well-developed.  HENT:     Head: Normocephalic and atraumatic.     Ears:     Comments: Moth to L EOC Eyes:     Pupils: Pupils are equal, round, and reactive to light.  Neck:     Vascular: No JVD.  Cardiovascular:     Rate and Rhythm: Normal rate and regular rhythm.     Heart sounds: No murmur heard.    No friction rub. No gallop.  Pulmonary:     Effort: No respiratory distress.     Breath sounds: No wheezing.  Abdominal:      General: There is no distension.     Tenderness: There is no abdominal tenderness. There is no guarding or rebound.  Musculoskeletal:        General: Normal range of motion.     Cervical back: Normal range of motion and neck supple.  Skin:    Coloration: Skin is not pale.     Findings: No rash.  Neurological:     Mental Status: He is alert and oriented to person, place, and time.  Psychiatric:        Behavior: Behavior normal.     ED Results / Procedures / Treatments   Labs (all labs ordered are listed, but only abnormal results are displayed) Labs Reviewed - No data to display  EKG None  Radiology No results found.  Procedures Procedures    Medications Ordered in ED Medications  lidocaine-EPINEPHrine (XYLOCAINE W/EPI) 2 %-1:200000 (PF) injection 10 mL (10 mLs Intradermal Given 09/02/21 2150)    ED Course/ Medical Decision Making/ A&P  Medical Decision Making Risk Prescription drug management.   63 yoM with a chief complaint of a moth flying into his left ear.  This was killed with lidocaine and then irrigated out with elephant ear wash.  Then discharged home.  11:34 PM:  I have discussed the diagnosis/risks/treatment options with the patient.  Evaluation and diagnostic testing in the emergency department does not suggest an emergent condition requiring admission or immediate intervention beyond what has been performed at this time.  They will follow up with  PCP. We also discussed returning to the ED immediately if new or worsening sx occur. We discussed the sx which are most concerning (e.g., sudden worsening pain, fever, inability to tolerate by mouth) that necessitate immediate return. Medications administered to the patient during their visit and any new prescriptions provided to the patient are listed below.  Medications given during this visit Medications  lidocaine-EPINEPHrine (XYLOCAINE W/EPI) 2 %-1:200000 (PF) injection 10 mL (10 mLs  Intradermal Given 09/02/21 2150)     The patient appears reasonably screen and/or stabilized for discharge and I doubt any other medical condition or other [facility] requiring further screening, evaluation, or treatment in the ED at this time prior to discharge.          Final Clinical Impression(s) / ED Diagnoses Final diagnoses:  Foreign body of left ear, initial encounter    Rx / DC Orders ED Discharge Orders     None         Deno Etienne, DO 09/02/21 2334

## 2021-09-02 NOTE — ED Triage Notes (Signed)
Pt states he was out in his garage and a moth flew in his left ear

## 2021-09-11 ENCOUNTER — Encounter: Payer: Self-pay | Admitting: Family Medicine

## 2021-09-11 ENCOUNTER — Ambulatory Visit (INDEPENDENT_AMBULATORY_CARE_PROVIDER_SITE_OTHER): Payer: 59 | Admitting: Family Medicine

## 2021-09-11 VITALS — BP 112/68 | HR 84 | Temp 98.0°F | Ht 68.0 in | Wt 165.5 lb

## 2021-09-11 DIAGNOSIS — R202 Paresthesia of skin: Secondary | ICD-10-CM | POA: Diagnosis not present

## 2021-09-11 DIAGNOSIS — Z1211 Encounter for screening for malignant neoplasm of colon: Secondary | ICD-10-CM | POA: Diagnosis not present

## 2021-09-11 DIAGNOSIS — F411 Generalized anxiety disorder: Secondary | ICD-10-CM | POA: Diagnosis not present

## 2021-09-11 MED ORDER — ALPRAZOLAM 0.5 MG PO TABS: 0.5000 mg | ORAL_TABLET | Freq: Every day | ORAL | 2 refills | Status: DC | PRN

## 2021-09-11 MED ORDER — PREDNISONE 10 MG (21) PO TBPK: ORAL_TABLET | ORAL | 0 refills | Status: DC

## 2021-09-11 NOTE — Progress Notes (Signed)
Chief Complaint  Patient presents with   penis pain    Referral colonoscopy    Travis Rose is a 51 y.o. male here for penile discomfort.  Over the past week, the patient is experiencing a recurrent buzzing sensation in the head of his penis.  There is no pain or urinary complaints.  Denies any fevers, abdominal pain, or new sexual partners.  He has been driving an old [place] that is a manual and has been using his left leg more so.  He started doing some stretches and reports the issue is 90% better.  He wants to make sure that this is nothing sinister.  Patient will sometimes get furuncles in his perennial region.  He believes he is having the start of one now.  A prednisone pack has helped in the past.  He is using triple antibiotic ointment lately as well.  Past Medical History:  Diagnosis Date   Allergy      BP 112/68   Pulse 84   Temp 98 F (36.7 C) (Oral)   Ht '5\' 8"'$  (1.727 m)   Wt 165 lb 8 oz (75.1 kg)   SpO2 97%   BMI 25.16 kg/m  General: Awake, alert, appears stated age Heart: RRR Lungs: CTAB, normal respiratory effort, no accessory muscle usage Abd: BS+, soft, NT, ND, no masses or organomegaly MSK: No CVA tenderness, neg Lloyd's sign Psych: Age appropriate judgment and insight  Paresthesia - Plan: Urine Culture, Urinalysis,  Screening for colon cancer - Plan: Ambulatory referral to Gastroenterology  GAD (generalized anxiety disorder) - Plan: ALPRAZolam (XANAX) 0.5 MG tablet  Could be pudendal nerve irritation, continue stretches.  Check urine today.  Does not sound like anything sinister, including an infection. Referral to GI team for CCS placed today.  F/u prn. The patient voiced understanding and agreement to the plan.  [place], DO 09/11/21 3:46 PM

## 2021-09-11 NOTE — Patient Instructions (Addendum)
If you do not hear anything about your referral in the next 1-2 weeks, call our office and ask for an update.  Foods that may reduce pain: 1) Ginger 2) Blueberries 3) [place] 4) Pumpkin seeds 5) dark chocolate 6) turmeric 7) tart cherries 8) virgin olive oil 9) chilli peppers 10) mint 11) krill oil  Let us know if you need anything.

## 2021-09-12 LAB — URINE CULTURE
MICRO NUMBER:: 13891651
Result:: NO GROWTH
SPECIMEN QUALITY:: ADEQUATE

## 2021-09-12 LAB — TIQ-MISC

## 2021-11-06 ENCOUNTER — Ambulatory Visit (INDEPENDENT_AMBULATORY_CARE_PROVIDER_SITE_OTHER): Payer: 59 | Admitting: Family Medicine

## 2021-11-06 ENCOUNTER — Encounter: Payer: Self-pay | Admitting: Family Medicine

## 2021-11-06 VITALS — BP 110/70 | HR 56 | Temp 97.8°F | Ht 67.0 in | Wt 167.0 lb

## 2021-11-06 DIAGNOSIS — M79642 Pain in left hand: Secondary | ICD-10-CM

## 2021-11-06 NOTE — Progress Notes (Signed)
Musculoskeletal Exam  Patient: Travis Rose DOB: [birth year]  DOS: 11/06/2021  SUBJECTIVE:  Chief Complaint:   Chief Complaint  Patient presents with   Hand Pain    Left     Travis Rose is a 51 y.o.  male for evaluation and treatment of L hand pain.   Onset:  1 month ago. No inj or change in activity.  Location: L 2nd MCP/knuckle Character:  burning Progression of issue:  is unchanged Associated symptoms: losing grip strength No redness, bruising, swelling, decreased ROM.  Treatment: to date has been OTC NSAIDS and topical NSAIDs.   Neurovascular symptoms: no  Past Medical History:  Diagnosis Date   Allergy     Objective: VITAL SIGNS: BP 110/70 (BP Location: Left Arm, Patient Position: Sitting, Cuff Size: Normal)   Pulse (!) 56   Temp 97.8 F (36.6 C) (Oral)   Ht '5\' 7"'$  (1.702 m)   Wt 167 lb (75.8 kg)   SpO2 99%   BMI 26.16 kg/m  Constitutional: Well formed, well developed. No acute distress. Thorax & Lungs: No accessory muscle use Heart: Brisk cap refill Musculoskeletal: L hand.   Normal active range of motion: Yes.   Normal passive range of motion: Yes Tenderness to palpation: no Deformity: no Ecchymosis: no Tests positive: none Tests negative: Spurling's Neurologic: Normal sensory function. No focal deficits noted. Psychiatric: Normal mood. Age appropriate judgment and insight. Alert & oriented x 3.    Assessment:  Pain of left hand  Plan: Stretches/exercises, heat, ice, Tylenol. OK to refill Pennsaid if/when he needs to [place]. Would consider having him see sports med vs OT if no improvement in the next few weeks. He will also avoid heavy dumbbells/barbells or anything that requires a strong grip.  F/u prn. The patient voiced understanding and agreement to the plan.   [place], DO 11/06/21  8:59 AM

## 2021-11-06 NOTE — Patient Instructions (Addendum)
Heat (pad or rice pillow in microwave) over affected area, 10-15 minutes twice daily.   Ice/cold pack over area for 10-15 min twice daily.  OK to take Tylenol 1000 mg (2 extra strength tabs) or 975 mg (3 regular strength tabs) every 6 hours as needed.  Avoid heavy gripping.   Foods that may reduce pain: 1) Ginger 2) Blueberries 3) Salmon 4) Pumpkin seeds 5) dark chocolate 6) turmeric 7) tart cherries 8) virgin olive oil 9) chilli peppers 10) mint 11) krill oil  Please contact the GI team at: [phone]. This is to schedule your colonoscopy.   Let us know if you need anything.  Hand Exercises Hand exercises can be helpful for almost anyone. These exercises can strengthen the hands, improve flexibility and movement, and increase blood flow to the hands. These results can make work and daily tasks easier. Hand exercises can be especially helpful for people who have joint pain from arthritis or have nerve damage from overuse (carpal tunnel syndrome). These exercises can also help people who have injured a hand. Exercises Most of these hand exercises are gentle stretching and motion exercises. It is usually safe to do them often throughout the day. Warming up your hands before exercise may help to reduce stiffness. You can do this with gentle massage or by placing your hands in warm water for 10-15 minutes. It is normal to feel some stretching, pulling, tightness, or mild discomfort as you begin new exercises. This will gradually improve. Stop an exercise right away if you feel sudden, severe pain or your pain gets worse. Ask your health care provider which exercises are best for you. Knuckle bend or "claw" fist Stand or sit with your arm, hand, and all five fingers pointed straight up. Make sure to keep your wrist straight during the exercise. Gently bend your fingers down toward your palm until the tips of your fingers are touching the top of your palm. Keep your big knuckle straight  and just bend the small knuckles in your fingers. Hold this position for 3 seconds. Straighten (extend) your fingers back to the starting position. Repeat this exercise 5-10 times with each hand. Full finger fist Stand or sit with your arm, hand, and all five fingers pointed straight up. Make sure to keep your wrist straight during the exercise. Gently bend your fingers into your palm until the tips of your fingers are touching the middle of your palm. Hold this position for 3 seconds. Extend your fingers back to the starting position, stretching every joint fully. Repeat this exercise 5-10 times with each hand. Straight fist Stand or sit with your arm, hand, and all five fingers pointed straight up. Make sure to keep your wrist straight during the exercise. Gently bend your fingers at the big knuckle, where your fingers meet your hand, and the middle knuckle. Keep the knuckle at the tips of your fingers straight and try to touch the bottom of your palm. Hold this position for 3 seconds. Extend your fingers back to the starting position, stretching every joint fully. Repeat this exercise 5-10 times with each hand. Tabletop Stand or sit with your arm, hand, and all five fingers pointed straight up. Make sure to keep your wrist straight during the exercise. Gently bend your fingers at the big knuckle, where your fingers meet your hand, as far down as you can while keeping the small knuckles in your fingers straight. Think of forming a tabletop with your fingers. Hold this position for 3 seconds.  Extend your fingers back to the starting position, stretching every joint fully. Repeat this exercise 5-10 times with each hand. Finger spread Place your hand flat on a table with your palm facing down. Make sure your wrist stays straight as you do this exercise. Spread your fingers and thumb apart from each other as far as you can until you feel a gentle stretch. Hold this position for 3  seconds. Bring your fingers and thumb tight together again. Hold this position for 3 seconds. Repeat this exercise 5-10 times with each hand. Making circles Stand or sit with your arm, hand, and all five fingers pointed straight up. Make sure to keep your wrist straight during the exercise. Make a circle by touching the tip of your thumb to the tip of your index finger. Hold for 3 seconds. Then open your hand wide. Repeat this motion with your thumb and each finger on your hand. Repeat this exercise 5-10 times with each hand. Thumb motion Sit with your forearm resting on a table and your wrist straight. Your thumb should be facing up toward the ceiling. Keep your fingers relaxed as you move your thumb. Lift your thumb up as high as you can toward the ceiling. Hold for 3 seconds. Bend your thumb across your palm as far as you can, reaching the tip of your thumb for the small finger (pinkie) side of your palm. Hold for 3 seconds. Repeat this exercise 5-10 times with each hand. Grip strengthening  Hold a stress ball or other soft ball in the middle of your hand. Slowly increase the pressure, squeezing the ball as much as you can without causing pain. Think of bringing the tips of your fingers into the middle of your palm. All of your finger joints should bend when doing this exercise. Hold your squeeze for 3 seconds, then relax. Repeat this exercise 5-10 times with each hand. Contact a health care provider if: Your hand pain or discomfort gets much worse when you do an exercise. Your hand pain or discomfort does not improve within 2 hours after you exercise. If you have any of these problems, stop doing these exercises right away. Do not do them again unless your health care provider says that you can. Get help right away if: You develop sudden, severe hand pain or swelling. If this happens, stop doing these exercises right away. Do not do them again unless your health care provider says that  you can. Make sure you discuss any questions you have with your health care provider. Document Revised: 04/13/2018 Document Reviewed: 12/22/2017 Elsevier Patient Education  [place].

## 2021-11-09 ENCOUNTER — Encounter: Payer: Self-pay | Admitting: Internal Medicine

## 2021-11-24 ENCOUNTER — Ambulatory Visit (AMBULATORY_SURGERY_CENTER): Payer: 59

## 2021-11-24 VITALS — Ht 68.0 in | Wt 165.0 lb

## 2021-11-24 DIAGNOSIS — Z1211 Encounter for screening for malignant neoplasm of colon: Secondary | ICD-10-CM

## 2021-11-24 MED ORDER — NA SULFATE-K SULFATE-MG SULF 17.5-3.13-1.6 GM/177ML PO SOLN: 1.0000 | Freq: Once | ORAL | 0 refills | Status: AC

## 2021-11-24 NOTE — Progress Notes (Signed)
Pre visit completed via phone call; Patient verified name, DOB, and address;  No egg or soy allergy known to patient;  No issues known to pt with past sedation with any surgeries or procedures- no past surgical procedures; Patient denies ever being told they had issues or difficulty with intubation---no past surgical procedures; No FH of Malignant Hyperthermia; Pt is not on diet pills; Pt is not on home 02;  Pt is not on blood thinners;  Pt denies issues with constipation; No A fib or A flutter; Have any cardiac testing pending--NO Pt instructed to use Singlecare.com or GoodRx for a price reduction on prep;   Insurance verified during [facility] appt=[employer]  Patient's chart reviewed by Osvaldo Angst CNRA prior to previsit and patient appropriate for the [facility].  Previsit completed and red dot placed by patient's name on their procedure day (on provider's schedule).    Instructions sent via MyChart per patient request;

## 2021-12-02 ENCOUNTER — Ambulatory Visit (AMBULATORY_SURGERY_CENTER): Payer: 59 | Admitting: Internal Medicine

## 2021-12-02 ENCOUNTER — Encounter: Payer: Self-pay | Admitting: Internal Medicine

## 2021-12-02 VITALS — BP 110/72 | HR 64 | Temp 98.4°F | Resp 12 | Ht 68.0 in | Wt 165.0 lb

## 2021-12-02 DIAGNOSIS — Z1211 Encounter for screening for malignant neoplasm of colon: Secondary | ICD-10-CM

## 2021-12-02 DIAGNOSIS — D125 Benign neoplasm of sigmoid colon: Secondary | ICD-10-CM | POA: Diagnosis not present

## 2021-12-02 MED ORDER — SODIUM CHLORIDE 0.9 % IV SOLN: 500.0000 mL | Freq: Once | INTRAVENOUS | Status: DC

## 2021-12-02 NOTE — Progress Notes (Signed)
GASTROENTEROLOGY PROCEDURE H&P NOTE   Primary Care Physician: Shelda Pal, DO    Reason for Procedure:   Colon cancer screening  Plan:    Colonoscopy  Patient is appropriate for endoscopic procedure(s) in the ambulatory ([facility]) setting.  The nature of the procedure, as well as the risks, benefits, and alternatives were carefully and thoroughly reviewed with the patient. Ample time for discussion and questions allowed. The patient understood, was satisfied, and agreed to proceed.     HPI: Travis Rose is a 51 y.o. male who presents for colonoscopy for colon cancer screening. Denies blood in stools, changes in bowel habits, or unintentional weight loss. Grandmother or great-grandmother had colon cancer.  Past Medical History:  Diagnosis Date   Anxiety    situational anxiety   Arthritis    bilateral hands/knees   Seasonal allergies     Past Surgical History:  Procedure Laterality Date   NO PAST SURGERIES      Prior to Admission medications   Medication Sig Start Date End Date Taking? Authorizing Provider  Ascorbic Acid (VITAMIN C) 1000 MG tablet Take 1,000 mg by mouth daily.   Yes [provider]  Cholecalciferol (VITAMIN D-3 PO) Take 2 capsules by mouth daily at 6 (six) AM.   Yes [provider]  Multiple Vitamin (MULTIVITAMIN) capsule Take 3 capsules by mouth daily.   Yes [provider]  Omega-3 Fatty Acids (OMEGA 3 PO) Take 2 Capfuls by mouth daily at 6 (six) AM.   Yes [provider]  SUPER AMINO ACIDS PO Take 1 Dose by mouth daily as needed.   Yes [provider]  Whey Protein POWD Take 1 Dose by mouth daily at 6 (six) AM.   Yes [provider]  ALPRAZolam (XANAX) 0.5 MG tablet Take 1 tablet (0.5 mg total) by mouth daily as needed for anxiety. 09/11/21   Shelda Pal, DO  Diclofenac Sodium (PENNSAID) 2 % SOLN APPLY 1 PUMP (1 GRAM) TO AFFECTED AREA TOPICALLY TWICE DAILY AS DIRECTED 01/27/21    Rosemarie Ax, MD  sildenafil (VIAGRA) 100 MG tablet Take 0.5-1 tablets (50-100 mg total) by mouth daily as needed for erectile dysfunction. 06/24/21   Shelda Pal, DO    Current Outpatient Medications  Medication Sig Dispense Refill   Ascorbic Acid (VITAMIN C) 1000 MG tablet Take 1,000 mg by mouth daily.     Cholecalciferol (VITAMIN D-3 PO) Take 2 capsules by mouth daily at 6 (six) AM.     Multiple Vitamin (MULTIVITAMIN) capsule Take 3 capsules by mouth daily.     Omega-3 Fatty Acids (OMEGA 3 PO) Take 2 Capfuls by mouth daily at 6 (six) AM.     SUPER AMINO ACIDS PO Take 1 Dose by mouth daily as needed.     Whey Protein POWD Take 1 Dose by mouth daily at 6 (six) AM.     ALPRAZolam (XANAX) 0.5 MG tablet Take 1 tablet (0.5 mg total) by mouth daily as needed for anxiety. 30 tablet 2   Diclofenac Sodium (PENNSAID) 2 % SOLN APPLY 1 PUMP (1 GRAM) TO AFFECTED AREA TOPICALLY TWICE DAILY AS DIRECTED 112 g 2   sildenafil (VIAGRA) 100 MG tablet Take 0.5-1 tablets (50-100 mg total) by mouth daily as needed for erectile dysfunction. 30 tablet 2   Current Facility-Administered Medications  Medication Dose Route Frequency Provider Last Rate Last Admin   0.9 %  sodium chloride infusion  500 mL Intravenous Once Sharyn Creamer, MD  Allergies as of 12/02/2021   (No Known Allergies)    Family History  Problem Relation Age of Onset   Allergic rhinitis Mother    Allergic rhinitis Brother    Asthma Brother    Angioedema Neg Hx    Eczema Neg Hx    Immunodeficiency Neg Hx    Urticaria Neg Hx    Other Neg Hx        gynecomastia   Colon polyps Neg Hx    Colon cancer Neg Hx    Esophageal cancer Neg Hx    Rectal cancer Neg Hx    Stomach cancer Neg Hx     Social History   Socioeconomic History   Marital status: Single    Spouse name: Not on file   Number of children: Not on file   Years of education: Not on file   Highest education level: Not on file  Occupational History    Not on file  Tobacco Use   Smoking status: Former    Packs/day: 0.50    Years: 5.00    Total pack years: 2.50    Types: Cigarettes    Quit date: 01/04/2002    Years since quitting: 19.9   Smokeless tobacco: Never  Vaping Use   Vaping Use: Never used  Substance and Sexual Activity   Alcohol use: Not Currently    Alcohol/week: 0.0 - 3.0 standard drinks of alcohol    Comment: occ   Drug use: No   Sexual activity: Not on file  Other Topics Concern   Not on file  Social History Narrative   Not on file   Social Determinants of Health   Financial Resource Strain: Not on file  Food Insecurity: Not on file  Transportation Needs: Not on file  Physical Activity: Not on file  Stress: Not on file  Social Connections: Not on file  Intimate Partner Violence: Not on file    Physical Exam: Vital signs in last 24 hours: BP 109/74   Pulse 85   Temp 98.4 F (36.9 C)   Ht '5\' 8"'$  (1.727 m)   Wt 165 lb (74.8 kg)   SpO2 98%   BMI 25.09 kg/m  GEN: NAD EYE: Sclerae anicteric ENT: MMM CV: Non-tachycardic Pulm: No increased work of breathing GI: Soft, NT/ND NEURO:  Alert & Oriented   Christia Reading, MD [facility]  12/02/2021 1:13 PM

## 2021-12-02 NOTE — Progress Notes (Signed)
Pt's states no medical or surgical changes since previsit or office visit. 

## 2021-12-02 NOTE — Patient Instructions (Addendum)
-   Discharge patient to home (with escort). - Await pathology results. - The findings and recommendations were discussed with the patient.  Handouts on polyps and hemorrhoids given.  YOU HAD AN ENDOSCOPIC PROCEDURE TODAY AT [facility]:   Refer to the procedure report that was given to you for any specific questions about what was found during the examination.  If the procedure report does not answer your questions, please call your gastroenterologist to clarify.  If you requested that your care partner not be given the details of your procedure findings, then the procedure report has been included in a sealed envelope for you to review at your convenience later.  YOU SHOULD EXPECT: Some feelings of bloating in the abdomen. Passage of more gas than usual.  Walking can help get rid of the air that was put into your GI tract during the procedure and reduce the bloating. If you had a lower endoscopy (such as a colonoscopy or flexible sigmoidoscopy) you may notice spotting of blood in your stool or on the toilet paper. If you underwent a bowel prep for your procedure, you may not have a normal bowel movement for a few days.  Please Note:  You might notice some irritation and congestion in your nose or some drainage.  This is from the oxygen used during your procedure.  There is no need for concern and it should clear up in a day or so.  SYMPTOMS TO REPORT IMMEDIATELY:  Following lower endoscopy (colonoscopy or flexible sigmoidoscopy):  Excessive amounts of blood in the stool  Significant tenderness or worsening of abdominal pains  Swelling of the abdomen that is new, acute  Fever of 100F or higher  For urgent or emergent issues, a gastroenterologist can be reached at any hour by calling [phone]. Do not use MyChart messaging for urgent concerns.    DIET:  We do recommend a small meal at first, but then you may proceed to your regular diet.  Drink plenty of fluids but you  should avoid alcoholic beverages for 24 hours.  ACTIVITY:  You should plan to take it easy for the rest of today and you should NOT DRIVE or use heavy machinery until tomorrow (because of the sedation medicines used during the test).    FOLLOW UP: Our staff will call the number listed on your records the next business day following your procedure.  We will call around 7:15- 8:00 am to check on you and address any questions or concerns that you may have regarding the information given to you following your procedure. If we do not reach you, we will leave a message.     If any biopsies were taken you will be contacted by phone or by letter within the next 1-3 weeks.  Please call us at [phone] if you have not heard about the biopsies in 3 weeks.    SIGNATURES/CONFIDENTIALITY: You and/or your care partner have signed paperwork which will be entered into your electronic medical record.  These signatures attest to the fact that that the information above on your After Visit Summary has been reviewed and is understood.  Full responsibility of the confidentiality of this discharge information lies with you and/or your care-partner.

## 2021-12-02 NOTE — Progress Notes (Signed)
Called to room to assist during endoscopic procedure.  Patient ID and intended procedure confirmed with present staff. Received instructions for my participation in the procedure from the performing physician.  

## 2021-12-02 NOTE — Op Note (Signed)
[facility] Patient Name: Travis Rose Procedure Date: 12/02/2021 1:33 PM MRN: [id number] Endoscopist: Georgian Co , , [id number] Age: 51 Referring MD:  Date of Birth: 1970-11-14 Gender: Male Account #: [id number] Procedure:                Colonoscopy Indications:              Screening for colorectal malignant neoplasm, This                            is the patient's first colonoscopy Medicines:                Monitored Anesthesia Care Procedure:                Pre-Anesthesia Assessment:                           - Prior to the procedure, a History and Physical                            was performed, and patient medications and                            allergies were reviewed. The patient's tolerance of                            previous anesthesia was also reviewed. The risks                            and benefits of the procedure and the sedation                            options and risks were discussed with the patient.                            All questions were answered, and informed consent                            was obtained. Prior Anticoagulants: The patient has                            taken no anticoagulant or antiplatelet agents. ASA                            Grade Assessment: II - A patient with mild systemic                            disease. After reviewing the risks and benefits,                            the patient was deemed in satisfactory condition to                            undergo the procedure.  After obtaining informed consent, the colonoscope                            was passed under direct vision. Throughout the                            procedure, the patient's blood pressure, pulse, and                            oxygen saturations were monitored continuously. The                            CF [id number] #[id number] was introduced through the anus                            and advanced to  the the terminal ileum. The                            colonoscopy was performed without difficulty. The                            patient tolerated the procedure well. The quality                            of the bowel preparation was adequate. The terminal                            ileum, ileocecal valve, appendiceal orifice, and                            rectum were photographed. Scope In: 1:37:29 PM Scope Out: 1:50:55 PM Scope Withdrawal Time: 0 hours 9 minutes 33 seconds  Total Procedure Duration: 0 hours 13 minutes 26 seconds  Findings:                 The terminal ileum appeared normal.                           A 5 mm polyp was found in the sigmoid colon. The                            polyp was sessile. The polyp was removed with a                            cold snare. Resection and retrieval were complete.                           Non-bleeding internal hemorrhoids were found during                            retroflexion. Complications:            No immediate complications. Estimated Blood Loss:     Estimated blood loss was minimal. Impression:               -  The examined portion of the ileum was normal.                           - One 5 mm polyp in the sigmoid colon, removed with                            a cold snare. Resected and retrieved.                           - Non-bleeding internal hemorrhoids. Recommendation:           - Discharge patient to home (with escort).                           - Await pathology results.                           - The findings and recommendations were discussed                            with the patient. Dr Georgian Co "Lyndee Leo" Lorenso Courier,  12/02/2021 2:02:35 PM

## 2021-12-02 NOTE — Progress Notes (Signed)
A and O x3. Report to RN. Tolerated MAC anesthesia well. 

## 2021-12-03 ENCOUNTER — Telehealth: Payer: Self-pay

## 2021-12-03 NOTE — Telephone Encounter (Signed)
  Follow up Call-     12/02/2021    1:00 PM  Call back number  Post procedure Call Back phone  # [phone]  Permission to leave phone message Yes     Patient questions:  Do you have a fever, pain , or abdominal swelling? No. Pain Score  0 *  Have you tolerated food without any problems? Yes.    Have you been able to return to your normal activities? Yes.    Do you have any questions about your discharge instructions: Diet   No. Medications  No. Follow up visit  No.  Do you have questions or concerns about your Care? No.  Actions: * If pain score is 4 or above: No action needed, pain <4.

## 2021-12-09 ENCOUNTER — Encounter: Payer: Self-pay | Admitting: Internal Medicine

## 2021-12-16 ENCOUNTER — Encounter: Payer: Self-pay | Admitting: Family Medicine

## 2021-12-16 ENCOUNTER — Other Ambulatory Visit: Payer: Self-pay | Admitting: Family Medicine

## 2021-12-16 DIAGNOSIS — M79642 Pain in left hand: Secondary | ICD-10-CM

## 2021-12-16 MED ORDER — DICLOFENAC SODIUM 2 % EX SOLN: CUTANEOUS | 2 refills | Status: DC

## 2021-12-18 ENCOUNTER — Ambulatory Visit (HOSPITAL_BASED_OUTPATIENT_CLINIC_OR_DEPARTMENT_OTHER)
Admission: RE | Admit: 2021-12-18 | Discharge: 2021-12-18 | Disposition: A | Discharge status: Home / Self Care | Payer: 59 | Source: Ambulatory Visit | Attending: Family Medicine | Admitting: Family Medicine

## 2021-12-18 DIAGNOSIS — M79642 Pain in left hand: Secondary | ICD-10-CM | POA: Diagnosis present

## 2021-12-21 ENCOUNTER — Telehealth: Payer: Self-pay

## 2021-12-21 NOTE — Telephone Encounter (Signed)
PA initiated via Covermymeds; KEY: [id number]. Awaiting determination.

## 2021-12-22 NOTE — Telephone Encounter (Signed)
PA denied.   The requested medication and/or diagnosis are not a covered benefit and are excluded from coverage in accordance with the terms and conditions of your plan benefit. Therefore, this request has been administratively denied. Reviewed by: Levonne Lapping.N

## 2022-01-13 ENCOUNTER — Encounter: Payer: Self-pay | Admitting: Family Medicine

## 2022-01-13 ENCOUNTER — Ambulatory Visit (INDEPENDENT_AMBULATORY_CARE_PROVIDER_SITE_OTHER): Payer: 59 | Admitting: Family Medicine

## 2022-01-13 VITALS — BP 122/72 | HR 67 | Temp 98.1°F | Ht 68.0 in | Wt 173.0 lb

## 2022-01-13 DIAGNOSIS — L0292 Furuncle, unspecified: Secondary | ICD-10-CM

## 2022-01-13 MED ORDER — HYDROCODONE-ACETAMINOPHEN 5-325 MG PO TABS: 1.0000 | ORAL_TABLET | Freq: Four times a day (QID) | ORAL | 0 refills | Status: DC | PRN

## 2022-01-13 MED ORDER — DOXYCYCLINE HYCLATE 100 MG PO TABS: 100.0000 mg | ORAL_TABLET | Freq: Two times a day (BID) | ORAL | 0 refills | Status: AC

## 2022-01-13 NOTE — Patient Instructions (Addendum)
Ice/cold pack over area for 10-15 min twice daily.  OK to take Tylenol 1000 mg (2 extra strength tabs) or 975 mg (3 regular strength tabs) every 6 hours as needed.  Try to drain the area yourself.   Do not drink alcohol, do any illicit/street drugs, drive or do anything that requires alertness while on this medicine.   Let us know if you need anything.

## 2022-01-13 NOTE — Progress Notes (Signed)
Chief Complaint  Patient presents with   Cyst    Onset 01/10/2022 Had tried prednisone but not working     Travis Rose is a 52 y.o. male here for a skin complaint.  Duration: 3 days Location: perianal region Pruritic? No Painful? Yes Drainage? No New soaps/lotions/topicals/detergents? No Other associated symptoms: getting worse, no fevers Therapies tried thus far: prednisone, mupirocin  Past Medical History:  Diagnosis Date   Anxiety    situational anxiety   Arthritis    bilateral hands/knees   Seasonal allergies     BP 122/72 (BP Location: Left Arm, Patient Position: Sitting, Cuff Size: Normal)   Pulse 67   Temp 98.1 F (36.7 C) (Oral)   Ht '5\' 8"'$  (1.727 m)   Wt 173 lb (78.5 kg)   SpO2 100%   BMI 26.30 kg/m  Gen: awake, alert, appearing stated age Lungs: No accessory muscle use Skin: in the R perianal region, there is a small patch of erythema with a circular nodule measuring approx 1.5 cm in diameter, +ttp, excessive warmth, drainage of purulent material. No excoriation Psych: Age appropriate judgment and insight  Furuncle - Plan: doxycycline (VIBRA-TABS) 100 MG tablet, HYDROcodone-acetaminophen (NORCO) 5-325 MG tablet  7 d of doxy. Keep c/d. He will try to drain at home as he now knows it is open. May have to return for I&D if no improvement. Norco prn. Warnings verbalized and written down.  F/u prn. The patient voiced understanding and agreement to the plan.  [place], DO 01/13/22 8:51 AM

## 2022-01-19 ENCOUNTER — Encounter: Payer: Self-pay | Admitting: Family Medicine

## 2022-01-19 ENCOUNTER — Ambulatory Visit (INDEPENDENT_AMBULATORY_CARE_PROVIDER_SITE_OTHER): Payer: 59 | Admitting: Family Medicine

## 2022-01-19 VITALS — BP 118/72 | HR 68 | Temp 97.7°F | Ht 67.0 in | Wt 173.1 lb

## 2022-01-19 DIAGNOSIS — L0291 Cutaneous abscess, unspecified: Secondary | ICD-10-CM

## 2022-01-19 NOTE — Progress Notes (Signed)
Chief Complaint  Patient presents with   Cyst    Travis Rose is a 52 y.o. male here for f/u.  Duration: 9 days Location: Perianal region, R Pruritic? No Painful? Yes Drainage? Yes New soaps/lotions/topicals/detergents? No Other associated symptoms: no fevers, seems to be getting bigger, drained earlier today Therapies tried thus far: doxycycline, mupirocin, prednisone  Past Medical History:  Diagnosis Date   Anxiety    situational anxiety   Arthritis    bilateral hands/knees   Seasonal allergies     BP 118/72 (BP Location: Left Arm, Patient Position: Sitting, Cuff Size: Normal)   Pulse 68   Temp 97.7 F (36.5 C) (Oral)   Ht '5\' 7"'$  (1.702 m)   Wt 173 lb 2 oz (78.5 kg)   SpO2 99%   BMI 27.12 kg/m  Gen: awake, alert, appearing stated age Lungs: No accessory muscle use Skin: R perianal region there is an excoriated opening with erythema, some fluctuance overlying induration, +ttp. Psych: Age appropriate judgment and insight  Procedure note; incision and drainage Informed consent obtained. The area was cleaned with alcohol. The area was anesthetized with 2 mL of 1% lidocaine with epinephrine. Once adequate anesthesia was obtained, a cruciate incision was made with 11 blade scalpel. Approximately 2 mL of purulent material with blood was expressed. Cultures were taken. Loculations were interrupted with a curved hemostat. The area was packed with approximately 3 cm of 0.25 in iodoform gauze. The area was then dressed with gauze. There were no complications noted. The patient tolerated the procedure well.  Abscess - Plan: PR INCISION & DRAINAGE ABSCESS SIMPLE/SINGLE  Aftercare instructions verbalized and written down. Warning signs and symptoms verbalized and written down in AVS. Ice, sitz baths/soaks, Tylenol, compresses.  F/u in 1 week to reck. The patient voiced understanding and agreement to the plan.  [place], DO 01/19/22 3:21 PM

## 2022-01-19 NOTE — Patient Instructions (Signed)
Leave the packing in as long as possible.  OK to take Tylenol 1000 mg (2 extra strength tabs) or 975 mg (3 regular strength tabs) every 6 hours as needed.  Ice/cold pack over area for 10-15 min twice daily.  Ibuprofen 400-600 mg (2-3 over the counter strength tabs) every 6 hours as needed for pain.  Sitz baths/soaks 1-2 times daily for 10-15 min.   Keep the area clean and dry.   Things to look out for: increasing pain not relieved by ibuprofen/acetaminophen, fevers, spreading redness, increasing drainage of pus, or a new foul odor.  Let us know if you need anything.

## 2022-01-21 ENCOUNTER — Encounter: Payer: Self-pay | Admitting: Family Medicine

## 2022-04-19 ENCOUNTER — Encounter: Payer: Self-pay | Admitting: *Deleted

## 2022-10-19 ENCOUNTER — Ambulatory Visit (HOSPITAL_BASED_OUTPATIENT_CLINIC_OR_DEPARTMENT_OTHER)
Admission: RE | Admit: 2022-10-19 | Discharge: 2022-10-19 | Disposition: A | Discharge status: Home / Self Care | Payer: 59 | Source: Ambulatory Visit | Attending: Sports Medicine | Admitting: Sports Medicine

## 2022-10-19 ENCOUNTER — Encounter: Payer: Self-pay | Admitting: Sports Medicine

## 2022-10-19 ENCOUNTER — Ambulatory Visit (INDEPENDENT_AMBULATORY_CARE_PROVIDER_SITE_OTHER): Payer: 59 | Admitting: Sports Medicine

## 2022-10-19 VITALS — BP 108/70 | Ht 67.0 in

## 2022-10-19 DIAGNOSIS — G8929 Other chronic pain: Secondary | ICD-10-CM

## 2022-10-19 DIAGNOSIS — M25511 Pain in right shoulder: Secondary | ICD-10-CM | POA: Insufficient documentation

## 2022-10-19 NOTE — Progress Notes (Signed)
   Subjective:    Patient ID: Travis Rose, male    DOB: 1970-12-01, 52 y.o.   MRN: [id number]  HPI chief complaint: Right shoulder pain  Patient is a very pleasant and active 52 year old right-hand-dominant gentleman that presents today with several months of right shoulder pain.  He initially injured the shoulder in 2020.  An MRI arthrogram at that time showed a very large tear involving the superior, posterior superior, and posterior inferior labrum.  It was also noted that he had some full-thickness cartilage loss over the posterior humeral head.  Patient met with Dr. Dion Saucier and ultimately surgery was avoided.  Patient made a full recovery and has been doing well up until a few months ago.  His pain has begun to return without any new injury.  His pain is along the lateral shoulder but will radiate into the biceps.  He enjoys working out and has had to significantly modify some of his workouts due to his pain.  He has been seeing Travis Rose for some physical therapy and Travis Rose recommended that he see me today for an ultrasound to see if there is any rotator cuff involvement.  No prior shoulder surgeries.  Past medical history reviewed Medications reviewed Allergies reviewed  Review of Systems As above    Objective:   Physical Exam  Well-developed, fit appearing.  No acute distress  Right shoulder: Full painless range of motion actively and passively.  No tenderness to palpation.  Slightly positive empty can, negative Hawkins.  Rotator cuff strength is 5/5 but does reproduce some pain with resisted supraspinatus.  Negative O'Brien's.  Negative clunk.  Positive speeds, negative Yergason's.  Good pulses.  MSK ultrasound of the right shoulder performed.  Biceps tendon was well-visualized in the bicipital groove in both long and short axis without abnormality.  There is an area of hypoechoic change in the anterior leading fibers of the supraspinatus suggestive of a partial-thickness tear  here.  AC joint showed some mild degenerative changes.      Assessment & Plan:   Right shoulder pain with ultrasound evidence of possible partial-thickness supraspinatus tear Remote labral tear  I recommended that the patient return to Travis Rose for a little more physical therapy now that we know that there may be some rotator cuff pathology present.  I would also like to get an x-ray of his shoulder and I will MyChart message him with those results when available.  If symptoms do not improve over the next month or so then I would consider an updated MRI arthrogram.  In the meantime, he will continue to modify his workouts using pain as his guide.  This note was dictated using Dragon naturally speaking software and may contain errors in syntax, spelling, or content which have not been identified prior to signing this note.

## 2022-11-10 ENCOUNTER — Encounter: Payer: Self-pay | Admitting: Sports Medicine

## 2023-04-26 ENCOUNTER — Encounter: Payer: Self-pay | Admitting: Sports Medicine

## 2023-04-26 ENCOUNTER — Ambulatory Visit (INDEPENDENT_AMBULATORY_CARE_PROVIDER_SITE_OTHER): Admitting: Sports Medicine

## 2023-04-26 VITALS — BP 110/72 | Ht 67.0 in

## 2023-04-26 DIAGNOSIS — S43431A Superior glenoid labrum lesion of right shoulder, initial encounter: Secondary | ICD-10-CM | POA: Diagnosis not present

## 2023-04-26 DIAGNOSIS — G8929 Other chronic pain: Secondary | ICD-10-CM | POA: Diagnosis not present

## 2023-04-26 DIAGNOSIS — M25511 Pain in right shoulder: Secondary | ICD-10-CM

## 2023-04-26 NOTE — Progress Notes (Signed)
   Subjective:    Patient ID: Travis Rose, male    DOB: 09-13-1970, 53 y.o.   MRN: [id number]  HPI  Patient presents today for follow-up on right shoulder pain.  He had extensive physical therapy with Abdul Abe.  He still endorses anterior shoulder pain with certain activities such as when placing the arm in an abducted and externally rotated position.  An MRI arthrogram done in 2020 showed a large labral tear involving the superior, posterior superior, and posterior inferior labrum.  He also had some early osteoarthritis.  He continues to be active but is limited in what he is able to do without pain.   Review of Systems As above    Objective:   Physical Exam  Well-developed, well-nourished.  No acute distress  Right shoulder: Good range of motion.  No tenderness to palpation.  No atrophy.  Rotator cuff strength is 5/5.  Minimal pain with resisted supraspinatus.  Negative O'Brien's.  Negative Yergason's.  Neurovascular intact distally.      Assessment & Plan:   Chronic right shoulder pain with MRI evidence of large labral tear  Although a recent ultrasound suggested a possible small rotator cuff tear, clinically I believe his pain is originating from the labrum.  I recommended that he return to Dr. Agatha Horsfall for advice.  He is wanting to avoid surgery if possible.  I will defer further workup and treatment to the discretion of Dr. Agatha Horsfall and the patient will follow-up with me as needed.  This note was dictated using Dragon naturally speaking software and may contain errors in syntax, spelling, or content which have not been identified prior to signing this note.

## 2023-06-23 ENCOUNTER — Emergency Department (HOSPITAL_BASED_OUTPATIENT_CLINIC_OR_DEPARTMENT_OTHER)

## 2023-06-23 ENCOUNTER — Emergency Department (HOSPITAL_BASED_OUTPATIENT_CLINIC_OR_DEPARTMENT_OTHER)
Admission: EM | Admit: 2023-06-23 | Discharge: 2023-06-24 | Disposition: A | Discharge status: Home / Self Care | Attending: Emergency Medicine | Admitting: Emergency Medicine

## 2023-06-23 ENCOUNTER — Encounter (HOSPITAL_BASED_OUTPATIENT_CLINIC_OR_DEPARTMENT_OTHER): Payer: Self-pay | Admitting: Emergency Medicine

## 2023-06-23 ENCOUNTER — Other Ambulatory Visit: Payer: Self-pay

## 2023-06-23 DIAGNOSIS — S7012XA Contusion of left thigh, initial encounter: Secondary | ICD-10-CM | POA: Insufficient documentation

## 2023-06-23 DIAGNOSIS — S8002XA Contusion of left knee, initial encounter: Secondary | ICD-10-CM | POA: Diagnosis not present

## 2023-06-23 DIAGNOSIS — S8012XA Contusion of left lower leg, initial encounter: Secondary | ICD-10-CM

## 2023-06-23 DIAGNOSIS — Y92410 Unspecified street and highway as the place of occurrence of the external cause: Secondary | ICD-10-CM | POA: Diagnosis not present

## 2023-06-23 DIAGNOSIS — S0181XA Laceration without foreign body of other part of head, initial encounter: Secondary | ICD-10-CM | POA: Insufficient documentation

## 2023-06-23 MED ORDER — LIDOCAINE-EPINEPHRINE (PF) 2 %-1:200000 IJ SOLN
10.0000 mL | Freq: Once | INTRAMUSCULAR | Status: AC
  Administered 2023-06-24: 10 mL
  Filled 2023-06-23: qty 20
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
## 2023-06-24 DIAGNOSIS — S0181XA Laceration without foreign body of other part of head, initial encounter: Secondary | ICD-10-CM | POA: Diagnosis not present

## 2023-06-24 MED ORDER — METHOCARBAMOL 500 MG PO TABS
750.0000 mg | ORAL_TABLET | Freq: Once | ORAL | Status: AC
  Administered 2023-06-24: 750 mg via ORAL
  Filled 2023-06-24: qty 2

## 2023-06-24 MED ORDER — OXYCODONE-ACETAMINOPHEN 5-325 MG PO TABS
1.0000 | ORAL_TABLET | Freq: Once | ORAL | Status: AC
  Administered 2023-06-24: 1 via ORAL
  Filled 2023-06-24: qty 1

## 2023-06-24 MED ORDER — IBUPROFEN 400 MG PO TABS
600.0000 mg | ORAL_TABLET | Freq: Once | ORAL | Status: AC
  Administered 2023-06-24: 600 mg via ORAL
  Filled 2023-06-24: qty 1

## 2023-06-24 MED ORDER — METHOCARBAMOL 750 MG PO TABS: 750.0000 mg | ORAL_TABLET | Freq: Four times a day (QID) | ORAL | 0 refills | Status: DC

## 2023-06-24 MED ORDER — OXYCODONE-ACETAMINOPHEN 5-325 MG PO TABS: 1.0000 | ORAL_TABLET | Freq: Four times a day (QID) | ORAL | 0 refills | Status: DC | PRN

## 2023-06-24 MED ORDER — IBUPROFEN 600 MG PO TABS: 600.0000 mg | ORAL_TABLET | Freq: Four times a day (QID) | ORAL | 0 refills | Status: AC | PRN

## 2023-06-24 NOTE — Discharge Instructions (Signed)
 Take the medications as prescribed for pain and inflammation. Follow up with your doctor as needed.

## 2023-06-27 ENCOUNTER — Encounter: Payer: Self-pay | Admitting: Family Medicine

## 2023-06-27 ENCOUNTER — Ambulatory Visit (INDEPENDENT_AMBULATORY_CARE_PROVIDER_SITE_OTHER): Admitting: Family Medicine

## 2023-06-27 VITALS — BP 126/86 | HR 67 | Temp 98.0°F | Resp 16 | Ht 68.0 in | Wt 162.8 lb

## 2023-06-27 DIAGNOSIS — M79652 Pain in left thigh: Secondary | ICD-10-CM

## 2023-06-27 DIAGNOSIS — S50819A Abrasion of unspecified forearm, initial encounter: Secondary | ICD-10-CM

## 2023-06-27 DIAGNOSIS — M25552 Pain in left hip: Secondary | ICD-10-CM

## 2023-06-27 DIAGNOSIS — F411 Generalized anxiety disorder: Secondary | ICD-10-CM

## 2023-06-27 DIAGNOSIS — S0181XA Laceration without foreign body of other part of head, initial encounter: Secondary | ICD-10-CM

## 2023-06-27 DIAGNOSIS — F430 Acute stress reaction: Secondary | ICD-10-CM | POA: Diagnosis not present

## 2023-06-27 DIAGNOSIS — M25562 Pain in left knee: Secondary | ICD-10-CM

## 2023-06-27 DIAGNOSIS — M25561 Pain in right knee: Secondary | ICD-10-CM | POA: Diagnosis not present

## 2023-06-27 DIAGNOSIS — S060X0A Concussion without loss of consciousness, initial encounter: Secondary | ICD-10-CM | POA: Diagnosis not present

## 2023-06-27 MED ORDER — ALPRAZOLAM 0.5 MG PO TABS: 0.5000 mg | ORAL_TABLET | Freq: Every day | ORAL | 2 refills | Status: AC | PRN
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
## 2023-06-27 NOTE — Patient Instructions (Signed)
 Fish oil 3 grams daily for 10 days then 2 grams daily  Vitamin D 4000 IU daily  CoQ10 200mg  daily for headaches  Tart cherry extract any dose at night  To help improve COGNITIVE function: Using fish oil/omega 3 that is 1000 mg (or roughly 600 mg EPA/DHA), starting as soon as possible after concussion, take: 3 tabs THREE TIMES a day  for the first 3 days, then (you will smell a little, sorry) 3 tabs TWICE DAILY  for the next 3 days, then 3 tabs ONCE DAILY  for the next 10 days    To help reduce HEADACHES: Coenzyme Q10 160mg  ONCE DAILY Riboflavin/Vitamin B2 400mg  ONCE DAILY Magnesium oxide 400 mg ONE-TWO TIMES DAILY May stop after headaches are resolved.                                                                                               To help with INSOMNIA: Melatonin 3-5mg  AT BEDTIME Tart cherry extract, any dose at night    Other medicines to help decrease inflammation Alpha Lipoic Acid 100mg  TWICE DAILY Turmeric 500mg  twice daily Iron 65mg  elemental daily Vitamin D 4000 IU daily for 2 weeks then 2000 IU daily thereafter.  Let us  know if you need anything.

## 2023-06-28 ENCOUNTER — Other Ambulatory Visit: Payer: Self-pay

## 2023-06-28 ENCOUNTER — Other Ambulatory Visit: Payer: Self-pay | Admitting: Family Medicine

## 2023-06-28 DIAGNOSIS — S0990XA Unspecified injury of head, initial encounter: Secondary | ICD-10-CM

## 2023-06-28 DIAGNOSIS — S060X0A Concussion without loss of consciousness, initial encounter: Secondary | ICD-10-CM

## 2023-07-05 ENCOUNTER — Encounter: Payer: Self-pay | Admitting: Sports Medicine

## 2023-07-05 ENCOUNTER — Encounter: Payer: Self-pay | Admitting: *Deleted

## 2023-07-05 ENCOUNTER — Ambulatory Visit: Admitting: Sports Medicine

## 2023-07-05 DIAGNOSIS — M25562 Pain in left knee: Secondary | ICD-10-CM
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
## 2023-07-07 ENCOUNTER — Encounter: Payer: Self-pay | Admitting: Neurology

## 2023-07-07 ENCOUNTER — Ambulatory Visit: Admitting: Neurology

## 2023-07-07 ENCOUNTER — Ambulatory Visit (INDEPENDENT_AMBULATORY_CARE_PROVIDER_SITE_OTHER): Admitting: Neurology

## 2023-07-07 VITALS — BP 111/68 | HR 79 | Ht 68.0 in | Wt 161.6 lb

## 2023-07-07 DIAGNOSIS — F418 Other specified anxiety disorders: Secondary | ICD-10-CM

## 2023-07-07 DIAGNOSIS — S060X0A Concussion without loss of consciousness, initial encounter: Secondary | ICD-10-CM
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
## 2023-07-07 NOTE — Patient Instructions (Signed)
 Concussion, Adult  A concussion is a brain injury from a hard, direct hit (trauma) to the head or body. This direct hit causes the brain to shake quickly back and forth inside the skull. This can damage brain cells and cause chemical changes in the brain. A concussion may also be known as a mild traumatic brain injury (TBI). The effects of a concussion can be serious. If you have a concussion, you should be very careful to avoid having a second concussion. What are the causes? This condition is caused by: A direct hit to your head. Sudden movement of your body that causes your brain to move back and forth inside the skull, such as in a car crash. What are the signs or symptoms? The signs of a concussion can be hard to notice. Early on, they may be missed by you, family members, and health care providers. You may look fine on the outside but may act or feel differently. Every head injury is different. Symptoms are usually temporary but may last for days, weeks, or even months. Some symptoms appear right away, but other symptoms may not show up for hours or days. Physical symptoms Headaches. Dizziness and problems with coordination or balance. Sensitivity to light or noise. Nausea or vomiting. Tiredness (fatigue). Vision or hearing problems. Seizure. Mental and emotional symptoms Irritability or mood changes. Memory problems. Trouble concentrating, organizing, or making decisions. Changes in eating or sleeping patterns. Slowness in thinking, acting or reacting, speaking, or reading. Anxiety or depression. How is this diagnosed? This condition is diagnosed based on your symptoms and injury. You may also have tests, including: Imaging tests, such as a CT scan or an MRI. Neuropsychological tests. These measure your thinking, understanding, learning, and memory. How is this treated? Treatment for this condition includes: Stopping sports or activity if you are injured. Physical and mental  rest and careful observation, usually at home. Medicines to help with symptoms such as headaches, nausea, or difficulty sleeping. Referral to a concussion clinic or rehab center. Follow these instructions at home: Activity Limit activities that require a lot of thought or concentration, such as: Doing homework or job-related work. Watching TV. Using the computer or phone. Playing memory games and doing puzzles. Rest helps your brain heal. Make sure you: Get plenty of sleep. Most adults should get 7-9 hours of sleep each night. Rest during the day. Take naps or rest breaks when you feel tired. Avoid high-intensity exercise or physical activities that take a lot of effort. Stop any activity that worsens symptoms. Your health care provider may recommend light exercise such as walking. Do not do high-risk activities that could cause a second concussion, such as riding a bike or playing sports. Ask your health care provider when you can return to your normal activities, such as school, work, sports, and driving. Your ability to react may be slower after a brain injury. Never do these activities if you are dizzy. General instructions  Take over-the-counter and prescription medicines only as told by your health care provider. Some medicines, such as blood thinners (anticoagulants) and aspirin, may increase the risk for complications, such as bleeding. Avoid taking opioid pain medicine while recovering from a concussion. Do not drink alcohol until your health care provider says you can. Drinking alcohol may slow your recovery and can put you at risk of further injury. Watch your symptoms and tell others around you to do the same. Complications sometimes occur after a concussion. Tell your work Production designer, theatre/television/film, teachers, Tax adviser,  school counselor, coach, or sports trainer about your injury, symptoms, and restrictions. See a mental health therapist if you feel anxious or depressed. Managing this condition  can be challenging. Keep all follow-up visits. Your health care provider will check on your recovery and give you a plan for returning to activities. How is this prevented? Avoiding another brain injury is very important. In rare cases, another injury can lead to permanent brain damage, brain swelling, or death. The risk of this is greatest during the first 7-10 days after a head injury. Avoid injuries by: Stopping activities that could lead to a second concussion, such as contact or recreational sports, until your health care provider says it is okay. Taking these actions once you have returned to sports or activities: Avoid plays or moves that can cause you to crash into another person. This is how most concussions occur. Follow the rules and be respectful of other players. Do not engage in violent or illegal plays. Getting regular exercise that includes strength and balance training. Wearing a properly fitting helmet during sports, biking, or other activities. Helmets can help protect you from serious skull and brain injuries, but they may not protect you from a concussion. Even when wearing a helmet, you should avoid being hit in the head. Where to find more information Centers for Disease Control and Prevention: TonerPromos.no Contact a health care provider if: Your symptoms do not improve or get worse. You have new symptoms. You have another injury. Your coordination gets worse. You have unusual behavior changes. Get help right away if: You have a severe or worsening headache. You have weakness or numbness in any part of your body, slurred speech, vision changes, or confusion. You vomit repeatedly. You lose consciousness, are sleepier than normal, or are difficult to wake up. You have a seizure. These symptoms may be an emergency. Get help right away. Call 911. Do not wait to see if the symptoms will go away. Do not drive yourself to the hospital. Also, get help right away if: You have  thoughts of hurting yourself or others. Take one of these steps if you feel like you may hurt yourself or others, or have thoughts about taking your own life: Go to your nearest emergency room. Call 911. Call the National Suicide Prevention Lifeline at 873 654 9351 or 988. This is open 24 hours a day. Text the Crisis Text Line at 859 396 5248. This information is not intended to replace advice given to you by your health care provider. Make sure you discuss any questions you have with your health care provider. Document Revised: 05/15/2021 Document Reviewed: 05/15/2021 Elsevier Patient Education  2024 [employer].

## 2023-07-10 ENCOUNTER — Encounter: Payer: Self-pay | Admitting: Neurology

## 2023-07-10 DIAGNOSIS — S060X0A Concussion without loss of consciousness, initial encounter: Secondary | ICD-10-CM | POA: Insufficient documentation

## 2023-07-13 ENCOUNTER — Ambulatory Visit (HOSPITAL_BASED_OUTPATIENT_CLINIC_OR_DEPARTMENT_OTHER)
Admission: RE | Admit: 2023-07-13 | Discharge: 2023-07-13 | Disposition: A | Discharge status: Home / Self Care | Source: Ambulatory Visit | Attending: Sports Medicine | Admitting: Sports Medicine

## 2023-07-13 DIAGNOSIS — M25462 Effusion, left knee: Secondary | ICD-10-CM | POA: Insufficient documentation

## 2023-07-13 DIAGNOSIS — M2242 Chondromalacia patellae, left knee: Secondary | ICD-10-CM | POA: Insufficient documentation

## 2023-07-13 DIAGNOSIS — M25562 Pain in left knee: Secondary | ICD-10-CM | POA: Insufficient documentation

## 2023-07-14 ENCOUNTER — Encounter: Payer: Self-pay | Admitting: Sports Medicine

## 2023-07-22 ENCOUNTER — Encounter: Payer: Self-pay | Admitting: Family Medicine

## 2023-10-07 ENCOUNTER — Encounter: Payer: Self-pay | Admitting: Sports Medicine

## 2023-10-07 ENCOUNTER — Ambulatory Visit (INDEPENDENT_AMBULATORY_CARE_PROVIDER_SITE_OTHER): Admitting: Sports Medicine

## 2023-10-07 VITALS — BP 112/78 | Ht 68.0 in | Wt 161.0 lb

## 2023-10-07 DIAGNOSIS — S76011A Strain of muscle, fascia and tendon of right hip, initial encounter: Secondary | ICD-10-CM

## 2023-10-07 NOTE — Progress Notes (Signed)
   Subjective:    Patient ID: Travis Rose, male    DOB: 03-07-1970, 53 y.o.   MRN: [id number]  HPI chief complaint: Right hip pain  G presents today with approximately 1 month of right groin pain.  He does not recall any specific injury.  He does recall recently building a deck and thinks that may be the culprit.  Pain has recently begun to radiate into the hip joint.  It is most noticeable with hip flexion.  Advil  and Aleve are helpful.   Review of Systems As above    Objective:   Physical Exam  Right hip: Smooth painless hip range of motion with a negative logroll.  Negative FADIR.  No tenderness to palpation but there is reproducible pain with resisted adduction.  Neurovascularly intact distally.      Assessment & Plan:   Right hip pain likely secondary to adductor strain  [facility]  He will probably only need 1 visit for education and a home exercise program.  If symptoms persist for another 3 to 4 weeks despite rehabilitation, consider imaging at that time.  Follow-up for ongoing or recalcitrant issues.  This note was dictated using Dragon naturally speaking software and may contain errors in syntax, spelling, or content which have not been identified prior to signing this note.

## 2023-11-22 ENCOUNTER — Ambulatory Visit

## 2023-11-22 VITALS — BP 110/80 | Ht 68.0 in | Wt 161.0 lb

## 2023-11-22 DIAGNOSIS — G8929 Other chronic pain: Secondary | ICD-10-CM

## 2023-11-22 DIAGNOSIS — S43431D Superior glenoid labrum lesion of right shoulder, subsequent encounter: Secondary | ICD-10-CM

## 2023-11-22 DIAGNOSIS — M25511 Pain in right shoulder: Secondary | ICD-10-CM | POA: Diagnosis not present
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
## 2023-11-29 ENCOUNTER — Ambulatory Visit (HOSPITAL_BASED_OUTPATIENT_CLINIC_OR_DEPARTMENT_OTHER)
Admission: RE | Admit: 2023-11-29 | Discharge: 2023-11-29 | Disposition: A | Discharge status: Home / Self Care | Source: Ambulatory Visit

## 2023-11-29 ENCOUNTER — Ambulatory Visit (INDEPENDENT_AMBULATORY_CARE_PROVIDER_SITE_OTHER)

## 2023-11-29 ENCOUNTER — Ambulatory Visit: Payer: Self-pay

## 2023-11-29 VITALS — BP 108/72 | Ht 68.0 in | Wt 161.0 lb

## 2023-11-29 DIAGNOSIS — M24811 Other specific joint derangements of right shoulder, not elsewhere classified: Secondary | ICD-10-CM

## 2023-11-29 DIAGNOSIS — S43431D Superior glenoid labrum lesion of right shoulder, subsequent encounter: Secondary | ICD-10-CM | POA: Diagnosis present

## 2023-11-29 DIAGNOSIS — M25511 Pain in right shoulder: Secondary | ICD-10-CM

## 2023-11-29 DIAGNOSIS — G8929 Other chronic pain: Secondary | ICD-10-CM | POA: Diagnosis not present

## 2023-11-29 MED ORDER — METHYLPREDNISOLONE ACETATE 40 MG/ML IJ SUSP
40.0000 mg | Freq: Once | INTRAMUSCULAR | Status: AC
  Administered 2023-11-29: 40 mg via INTRA_ARTICULAR

## 2023-11-29 NOTE — Progress Notes (Signed)
   Subjective:    Patient ID: Travis Rose, male    DOB: 53 y.o., Jun 27, 1970   MRN: [id number]  Chief Complaint: Right shoulder arthrogram contrast injection visit  History of Present Illness Patient previously evaluated by myself on 11/22/2023 where concern remained high for intra-articular pathology of his right shoulder.  MRI arthrogram was ordered.  Patient today presents for contrast injection for arthrogram which will immediately follow this visit.     Objective:   There were no vitals filed for this visit.  Right Intra-articular Shoulder contrast injection with Ultrasound Guidance Procedure Note Drayk Humbarger [birth year] Indications: Pain Procedure Details Following the description of risks including infection bleeding, damage to surrounding structures, patient provided verbal/written consent for Right glenohumeral arthrogram contrast injection (with steroid) procedure under ultrasound guidance. US  was used to identify the glenohumeral space. Patient was sterilely prepped in the usual fashion with chlorhexidine.  Following topical anesthetization with ethyl chloride they were injected with a solution of 0.1ml Gadavist, 40mg  Depo-medrol  and 4cc Mepivacaine 2%, and 10ml sterile saline. This was well visualized under ultrasound, please see associated photographic documentation. Patient tolerated well without complication.  Precautions provided. Cleaned and dressing applied.     Assessment & Plan:   Assessment & Plan Instructed patient to proceed immediately downstairs for his scheduled MRI appointment and move shoulder as little as possible.  Follow-up MRI results.

## 2023-12-06 ENCOUNTER — Ambulatory Visit: Payer: Self-pay

## 2023-12-13 ENCOUNTER — Encounter: Payer: Self-pay | Admitting: Family Medicine

## 2023-12-13 ENCOUNTER — Ambulatory Visit: Admitting: Family Medicine

## 2023-12-13 VITALS — BP 124/78 | HR 66 | Temp 98.0°F | Resp 16 | Ht 68.0 in | Wt 170.8 lb

## 2023-12-13 DIAGNOSIS — Z0000 Encounter for general adult medical examination without abnormal findings: Secondary | ICD-10-CM

## 2023-12-13 DIAGNOSIS — Z125 Encounter for screening for malignant neoplasm of prostate: Secondary | ICD-10-CM

## 2023-12-13 MED ORDER — SILDENAFIL CITRATE 100 MG PO TABS: 50.0000 mg | ORAL_TABLET | Freq: Every day | ORAL | 2 refills | Status: AC | PRN

## 2023-12-13 MED ORDER — TRAZODONE HCL 50 MG PO TABS: 25.0000 mg | ORAL_TABLET | Freq: Every evening | ORAL | 3 refills | Status: AC | PRN

## 2023-12-13 NOTE — Patient Instructions (Addendum)
 Give us  2-3 business days to get the results of your labs back.   Keep the diet clean and stay active.  Please get me a copy of your advanced directive form at your convenience.   Consider using GoodRx for the sildenafil .   The Shingrix vaccine (for shingles) is a 2 shot series spaced 2-6 months apart. It can make people feel low energy, achy and almost like they have the flu for 48 hours after injection. 1/5 people can have nausea and/or vomiting. Please plan accordingly when deciding on when to get this shot. Call our office for a nurse visit appointment to get this. The second shot of the series is less severe regarding the side effects, but it still lasts 48 hours.   Sleep is important to us  all. Getting good sleep is imperative to adequate functioning during the day. Work with our counselors who are trained to help people obtain quality sleep. Call [phone] to schedule an appointment or if you are curious about insurance coverage/cost.  Sleep Hygiene Tips: Do not watch TV or look at screens within 1 hour of going to bed. If you do, make sure there is a blue light filter (nighttime mode) involved. Try to go to bed around the same time every night. Wake up at the same time within 1 hour of regular time. Ex: If you wake up at 7 AM for work, do not sleep past 8 AM on days that you don't work. Do not drink alcohol before bedtime. Do not consume caffeine-containing beverages after noon or within 9 hours of intended bedtime. Get regular exercise/physical activity in your life, but not within 2 hours of planned bedtime. Do not take naps.  Do not eat within 2 hours of planned bedtime. Melatonin, 3-5 mg 30-60 minutes before planned bedtime may be helpful.  The bed should be for sleep or sex only. If after 20-30 minutes you are unable to fall asleep, get up and do something relaxing. Do this until you feel ready to go to sleep again.   Let us  know if you need anything.

## 2023-12-13 NOTE — Progress Notes (Signed)
 Chief Complaint  Patient presents with   Annual Exam    CPE    Well Male Travis Rose is here for a complete physical.   His last physical was >1 year ago.  Current diet: in general, a healthy diet.  Current exercise: Lifting weights, cardio Weight trend: stable Fatigue out of ordinary? No. Seat belt? Yes.   Advanced directive? No  Health maintenance Shingrix- No Colonoscopy- Yes Tetanus- Yes HIV- Yes Hep C- Yes   Past Medical History:  Diagnosis Date   Anxiety    situational anxiety   Arthritis    bilateral hands/knees   Seasonal allergies       Past Surgical History:  Procedure Laterality Date   NO PAST SURGERIES      Medications  Current Outpatient Medications on File Prior to Visit  Medication Sig Dispense Refill   ALPRAZolam  (XANAX ) 0.5 MG tablet Take 1 tablet (0.5 mg total) by mouth daily as needed for anxiety. 30 tablet 2   Cholecalciferol (VITAMIN D-3 PO) Take 2 capsules by mouth daily at 6 (six) AM.     ibuprofen  (ADVIL ) 600 MG tablet Take 1 tablet (600 mg total) by mouth every 6 (six) hours as needed. 30 tablet 0   Multiple Vitamin (MULTIVITAMIN) capsule Take 3 capsules by mouth daily.     Omega-3 Fatty Acids (OMEGA 3 PO) Take 2 Capfuls by mouth daily at 6 (six) AM.     oxyCODONE -acetaminophen  (PERCOCET/ROXICET) 5-325 MG tablet Take 1 tablet by mouth every 6 (six) hours as needed for severe pain (pain score 7-10). 15 tablet 0   sildenafil  (VIAGRA ) 100 MG tablet Take 0.5-1 tablets (50-100 mg total) by mouth daily as needed for erectile dysfunction. 30 tablet 2   SUPER AMINO ACIDS PO Take 1 Dose by mouth daily as needed.     Whey Protein POWD Take 1 Dose by mouth daily at 6 (six) AM.      Allergies No Known Allergies  Family History Family History  Problem Relation Age of Onset   Allergic rhinitis Brother    Asthma Brother    Angioedema Neg Hx    Eczema Neg Hx    Immunodeficiency Neg Hx    Urticaria Neg Hx    Other Neg Hx         gynecomastia   Colon polyps Neg Hx    Colon cancer Neg Hx    Esophageal cancer Neg Hx    Rectal cancer Neg Hx    Stomach cancer Neg Hx     Review of Systems: Constitutional:  no fevers Eye:  no recent significant change in vision Ear/Nose/Mouth/Throat:  Ears:  no hearing loss Nose/Mouth/Throat:  no complaints of nasal congestion, no sore throat Cardiovascular:  no chest pain Respiratory:  no shortness of breath Gastrointestinal:  no change in bowel habits GU:  Male: negative for dysuria, frequency Musculoskeletal/Extremities:  no joint pain Integumentary (Skin/Breast):  no abnormal skin lesions reported Neurologic:  no headaches Endocrine: No unexpected weight changes Hematologic/Lymphatic:  no abnormal bleeding  Exam BP 124/78 (BP Location: Left Arm, Patient Position: Sitting)   Pulse 66   Temp 98 F (36.7 C) (Oral)   Resp 16   Ht 5' 8 (1.727 m)   Wt 170 lb 12.8 oz (77.5 kg)   SpO2 98%   BMI 25.97 kg/m  General:  well developed, well nourished, in no apparent distress Skin:  no significant moles, warts, or growths Head:  no masses, lesions, or tenderness Eyes:  pupils equal  and round, sclera anicteric without injection Ears:  canals without lesions, TMs shiny without retraction, no obvious effusion, no erythema Nose:  nares patent, mucosa normal Throat/Pharynx:  lips and gingiva without lesion; tongue and uvula midline; non-inflamed pharynx; no exudates or postnasal drainage Neck: neck supple without adenopathy, thyromegaly, or masses Cardiac: RRR, no bruits, no LE edema Lungs:  clear to auscultation, breath sounds equal bilaterally, no respiratory distress Abdomen: BS+, soft, non-tender, non-distended, no masses or organomegaly noted Rectal: Deferred Musculoskeletal:  symmetrical muscle groups noted without atrophy or deformity Neuro:  gait normal; deep tendon reflexes normal and symmetric Psych: well oriented with normal range of affect and appropriate  judgment/insight  Assessment and Plan  Well adult exam - Plan: CBC, Comprehensive metabolic panel with GFR, Lipid panel  Screening for prostate cancer - Plan: PSA   Well 53 y.o. male. Counseled on diet and exercise. Counseled on risks and benefits of prostate cancer screening with PSA. The patient agrees to undergo testing. Advanced directive form provided today.  Shingrix and PCV 20 recommended.  Politely declined for today Flu shot politely declined. Immunizations, labs, and further orders as above. Follow up in 1 mo to see how he is doing with sleep. The patient voiced understanding and agreement to the plan.  Mabel [place], DO 12/13/23 4:06 PM

## 2023-12-14 ENCOUNTER — Ambulatory Visit: Payer: Self-pay | Admitting: Family Medicine

## 2023-12-14 ENCOUNTER — Other Ambulatory Visit

## 2023-12-14 DIAGNOSIS — Z0000 Encounter for general adult medical examination without abnormal findings: Secondary | ICD-10-CM | POA: Diagnosis not present

## 2023-12-14 DIAGNOSIS — Z125 Encounter for screening for malignant neoplasm of prostate: Secondary | ICD-10-CM

## 2023-12-14 LAB — COMPREHENSIVE METABOLIC PANEL WITH GFR
ALT: 21 U/L (ref 0–53)
AST: 19 U/L (ref 0–37)
Albumin: 4.6 g/dL (ref 3.5–5.2)
Alkaline Phosphatase: 45 U/L (ref 39–117)
BUN: 18 mg/dL (ref 6–23)
CO2: 31 meq/L (ref 19–32)
Calcium: 9.2 mg/dL (ref 8.4–10.5)
Chloride: 104 meq/L (ref 96–112)
Creatinine, Ser: 1.25 mg/dL (ref 0.40–1.50)
GFR: 65.53 mL/min (ref >60.00)
Glucose, Bld: 87 mg/dL (ref 70–99)
Potassium: 4.2 meq/L (ref 3.5–5.1)
Sodium: 140 meq/L (ref 135–145)
Total Bilirubin: 1 mg/dL (ref 0.2–1.2)
Total Protein: 6.3 g/dL (ref 6.0–8.3)

## 2023-12-14 LAB — CBC
HCT: 42.9 % (ref 39.0–52.0)
Hemoglobin: 14.6 g/dL (ref 13.0–17.0)
MCHC: 34.1 g/dL (ref 30.0–36.0)
MCV: 92.9 fl (ref 78.0–100.0)
Platelets: 176 K/uL (ref 150.0–400.0)
RBC: 4.61 Mil/uL (ref 4.22–5.81)
RDW: 13 % (ref 11.5–15.5)
WBC: 4.1 K/uL (ref 4.0–10.5)

## 2023-12-14 LAB — LIPID PANEL
Cholesterol: 143 mg/dL (ref 0–200)
HDL: 72.4 mg/dL (ref >39.00)
LDL Cholesterol: 58 mg/dL (ref 0–99)
NonHDL: 70.66
Total CHOL/HDL Ratio: 2
Triglycerides: 61 mg/dL (ref 0.0–149.0)
VLDL: 12.2 mg/dL (ref 0.0–40.0)

## 2023-12-14 LAB — PSA: PSA: 0.79 ng/mL (ref 0.10–4.00)

## 2024-02-02 ENCOUNTER — Ambulatory Visit: Payer: Self-pay

## 2024-02-02 ENCOUNTER — Other Ambulatory Visit: Payer: Self-pay

## 2024-02-02 VITALS — BP 104/62 | Ht 68.0 in | Wt 170.0 lb

## 2024-02-02 DIAGNOSIS — S43431D Superior glenoid labrum lesion of right shoulder, subsequent encounter: Secondary | ICD-10-CM

## 2024-02-02 DIAGNOSIS — G8929 Other chronic pain: Secondary | ICD-10-CM

## 2024-02-02 DIAGNOSIS — M25511 Pain in right shoulder: Secondary | ICD-10-CM

## 2024-02-02 DIAGNOSIS — M24811 Other specific joint derangements of right shoulder, not elsewhere classified: Secondary | ICD-10-CM

## 2024-02-02 NOTE — Progress Notes (Addendum)
" ° °  Subjective:    Patient ID: Travis Rose, male    DOB: 54 y.o., Dec 26, 1970   MRN: [id number]  Chief Complaint: PRP Injection visit - Right shoulder instability, near-circumferential labral tearing History of Present Illness Patient originally seen by myself on 11/22/2023 for progressive pain in his right shoulder for the preceding 6 months.  History of shoulder instability.  MRI confirming signs of shoulder instability and near circumferential tearing glenoid labrum.  Subsequently has had consultations with Dr. Redell David and Dr. Melita.  After discussion with these physicians, patient elected to proceed with PRP injection intra-articularly prior to considering surgery.     Objective:   Vitals:   02/02/24 1108  BP: 104/62   Right Intra-articular Shoulder PRP injection with Ultrasound Guidance Procedure Note Travis Rose 1970/03/07 Indications: Pain Procedure Details Following the description of risks including infection bleeding, damage to surrounding structures, patient provided verbal/written consent for right glenohumeral PRP injection procedure with ultrasound. US  was used to identify the glenohumeral space. Patient was sterilely prepped in the usual fashion with chlorhexidine.  Following topical anesthetization with ethyl chloride they were injected with ~4cc of PRP. This was well visualized under ultrasound, please see associated photographic documentation. Patient tolerated well without complication.  Precautions provided. Cleaned and dressing applied.     Assessment & Plan:   Assessment & Plan Travis Rose tolerated the procedure well today.  Instructed the patient to notify me in 4 weeks to inform me of his progress.  Can consider repeat PRP injection at that time if partial response.  No response, likely plan to proceed back to Dr. David to discuss potential surgery.   "
